# Patient Record
Sex: Female | Born: 1995 | Hispanic: No | Marital: Single | Smoking: Former smoker
Health system: Southern US, Community
[De-identification: ages and names within clinical notes are randomized; demographics above are authoritative.]

## PROBLEM LIST (undated history)

## (undated) ENCOUNTER — Ambulatory Visit (HOSPITAL_COMMUNITY): Admission: EM | Payer: Medicaid Other | Source: Home / Self Care

## (undated) DIAGNOSIS — N9489 Other specified conditions associated with female genital organs and menstrual cycle: Secondary | ICD-10-CM

## (undated) DIAGNOSIS — S060XAA Concussion with loss of consciousness status unknown, initial encounter: Secondary | ICD-10-CM

## (undated) DIAGNOSIS — S060X9A Concussion with loss of consciousness of unspecified duration, initial encounter: Secondary | ICD-10-CM

## (undated) DIAGNOSIS — D649 Anemia, unspecified: Secondary | ICD-10-CM

## (undated) HISTORY — PX: OTHER SURGICAL HISTORY: SHX169

## (undated) HISTORY — PX: WRIST SURGERY: SHX841

---
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
## 2016-08-31 ENCOUNTER — Emergency Department (HOSPITAL_COMMUNITY)
Admission: EM | Admit: 2016-08-31 | Discharge: 2016-08-31 | Disposition: A | Discharge status: Home / Self Care | Payer: Medicaid Other | Attending: Emergency Medicine | Admitting: Emergency Medicine

## 2016-08-31 ENCOUNTER — Emergency Department (HOSPITAL_COMMUNITY): Payer: Medicaid Other

## 2016-08-31 ENCOUNTER — Encounter (HOSPITAL_COMMUNITY): Payer: Self-pay | Admitting: Emergency Medicine

## 2016-08-31 DIAGNOSIS — Y999 Unspecified external cause status: Secondary | ICD-10-CM | POA: Diagnosis not present

## 2016-08-31 DIAGNOSIS — Y929 Unspecified place or not applicable: Secondary | ICD-10-CM | POA: Diagnosis not present

## 2016-08-31 DIAGNOSIS — S0083XA Contusion of other part of head, initial encounter: Secondary | ICD-10-CM | POA: Diagnosis not present

## 2016-08-31 DIAGNOSIS — M545 Low back pain, unspecified: Secondary | ICD-10-CM

## 2016-08-31 DIAGNOSIS — Y939 Activity, unspecified: Secondary | ICD-10-CM | POA: Diagnosis not present

## 2016-08-31 DIAGNOSIS — M542 Cervicalgia: Secondary | ICD-10-CM | POA: Diagnosis not present

## 2016-08-31 DIAGNOSIS — S0011XA Contusion of right eyelid and periocular area, initial encounter: Secondary | ICD-10-CM | POA: Insufficient documentation

## 2016-08-31 DIAGNOSIS — S0081XA Abrasion of other part of head, initial encounter: Secondary | ICD-10-CM | POA: Insufficient documentation

## 2016-08-31 DIAGNOSIS — R0789 Other chest pain: Secondary | ICD-10-CM | POA: Diagnosis not present

## 2016-08-31 DIAGNOSIS — R51 Headache: Secondary | ICD-10-CM

## 2016-08-31 DIAGNOSIS — R519 Headache, unspecified: Secondary | ICD-10-CM

## 2016-08-31 DIAGNOSIS — S0990XA Unspecified injury of head, initial encounter: Secondary | ICD-10-CM | POA: Diagnosis present

## 2016-08-31 MED ORDER — METHOCARBAMOL 500 MG PO TABS
1000.0000 mg | ORAL_TABLET | Freq: Once | ORAL | Status: AC
  Administered 2016-08-31: 1000 mg via ORAL
  Filled 2016-08-31: qty 2

## 2016-08-31 MED ORDER — OXYCODONE-ACETAMINOPHEN 5-325 MG PO TABS
1.0000 | ORAL_TABLET | Freq: Once | ORAL | Status: AC
  Administered 2016-08-31: 1 via ORAL
  Filled 2016-08-31: qty 1

## 2016-08-31 MED ORDER — IBUPROFEN 200 MG PO TABS
600.0000 mg | ORAL_TABLET | Freq: Once | ORAL | Status: AC
  Administered 2016-08-31: 14:00:00 600 mg via ORAL
  Filled 2016-08-31: qty 1

## 2016-08-31 MED ORDER — METHOCARBAMOL 500 MG PO TABS
500.0000 mg | ORAL_TABLET | Freq: Every evening | ORAL | 0 refills | Status: DC | PRN
Start: 2016-08-31 — End: 2018-05-19

## 2016-08-31 MED ORDER — IBUPROFEN 600 MG PO TABS: 600.0000 mg | ORAL_TABLET | Freq: Four times a day (QID) | ORAL | 0 refills | Status: DC | PRN

## 2016-08-31 MED ORDER — LORAZEPAM 1 MG PO TABS
0.5000 mg | ORAL_TABLET | Freq: Once | ORAL | Status: AC
  Administered 2016-08-31: 0.5 mg via ORAL
  Filled 2016-08-31: qty 1

## 2016-08-31 NOTE — Discharge Instructions (Signed)
Take Ibuprofen three times daily for the next week. Take this medicine with food. °Take muscle relaxer at bedtime to help you sleep. This medicine makes you drowsy so do not take before driving or work °Use a heating pad for sore muscles - use for 20 minutes several times a day °Return for worsening symptoms ° °

## 2016-08-31 NOTE — ED Notes (Signed)
Pt voids 250 to bedpan. 
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
## 2016-08-31 NOTE — ED Notes (Signed)
Pt returns from xray. Request further pain meds. Will inform provider.
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
## 2016-08-31 NOTE — ED Notes (Signed)
Pt states she understands instructions. Home stable with steady gait. 

## 2016-09-06 ENCOUNTER — Encounter (HOSPITAL_COMMUNITY): Payer: Self-pay | Admitting: *Deleted

## 2016-09-06 ENCOUNTER — Ambulatory Visit (HOSPITAL_COMMUNITY)
Admission: EM | Admit: 2016-09-06 | Discharge: 2016-09-06 | Disposition: A | Discharge status: Home / Self Care | Payer: Self-pay | Attending: Family Medicine | Admitting: Family Medicine

## 2016-09-06 DIAGNOSIS — K529 Noninfective gastroenteritis and colitis, unspecified: Secondary | ICD-10-CM

## 2016-09-06 DIAGNOSIS — G43A0 Cyclical vomiting, not intractable: Secondary | ICD-10-CM

## 2016-09-06 MED ORDER — ONDANSETRON 4 MG PO TBDP: 4.0000 mg | ORAL_TABLET | Freq: Three times a day (TID) | ORAL | 0 refills | Status: DC | PRN

## 2016-09-06 NOTE — ED Provider Notes (Signed)
CSN: [id number]     Arrival date & time 09/06/16  1647 History   First MD Initiated Contact with Patient 09/06/16 1730     Chief Complaint  Patient presents with  . Emesis   (Consider location/radiation/quality/duration/timing/severity/associated sxs/prior Treatment) Patient c/o nausea and vomiting and diarrhea for 2 days.     The history is provided by the patient.  Emesis  Severity:  Moderate Duration:  2 days Timing:  Constant Progression:  Improving Chronicity:  New Recent urination:  Normal Relieved by:  Nothing Worsened by:  Nothing Associated symptoms: diarrhea     History reviewed. No pertinent past medical history. Past Surgical History:  Procedure Laterality Date  . WRIST SURGERY     History reviewed. No pertinent family history. Social History  Substance Use Topics  . Smoking status: Never Smoker  . Smokeless tobacco: Not on file  . Alcohol use No   OB History    No data available     Review of Systems  Constitutional: Positive for fatigue.  HENT: Negative.   Eyes: Negative.   Respiratory: Negative.   Cardiovascular: Negative.   Gastrointestinal: Positive for diarrhea, nausea and vomiting.  Endocrine: Negative.   Genitourinary: Negative.   Musculoskeletal: Negative.   Skin: Negative.   Hematological: Negative.   Psychiatric/Behavioral: Negative.     Allergies  Lactose intolerance (gi)  Home Medications   Prior to Admission medications   Medication Sig Start Date End Date Taking? Authorizing Provider  Etonogestrel (NEXPLANON [place]) Inject 1 application into the skin once.    Historical Provider, MD  ibuprofen (ADVIL,MOTRIN) 600 MG tablet Take 1 tablet (600 mg total) by mouth every 6 (six) hours as needed. 08/31/16   Bethel BornKelly Marie Gekas, PA-C  MELATONIN ER PO Take 1 tablet by mouth at bedtime as needed.    Historical Provider, MD  methocarbamol (ROBAXIN) 500 MG tablet Take 1 tablet (500 mg total) by mouth at bedtime and may repeat dose one time if  needed. 08/31/16   Bethel BornKelly Marie Gekas, PA-C  ondansetron (ZOFRAN ODT) 4 MG disintegrating tablet Take 1 tablet (4 mg total) by mouth every 8 (eight) hours as needed for nausea or vomiting. 09/06/16   Deatra CanterWilliam J Jory Tanguma, FNP   Meds Ordered and Administered this Visit  Medications - No data to display  BP 130/80 (BP Location: Right Arm)   Pulse 82   Temp 98.6 F (37 C) (Oral)   Resp 18   LMP 08/24/2016   SpO2 100%  No data found.   Physical Exam  Constitutional: She appears well-developed and well-nourished.  HENT:  Head: Normocephalic and atraumatic.  Eyes: Conjunctivae and EOM are normal. Pupils are equal, round, and reactive to light.  Neck: Normal range of motion. Neck supple.  Cardiovascular: Normal rate, regular rhythm and normal heart sounds.   Pulmonary/Chest: Effort normal and breath sounds normal.  Abdominal: Soft. Bowel sounds are normal.  Nursing note and vitals reviewed.   Urgent Care Course     Procedures (including critical care time)  Labs Review Labs Reviewed - No data to display  Imaging Review No results found.   Visual Acuity Review  Right Eye Distance:   Left Eye Distance:   Bilateral Distance:    Right Eye Near:   Left Eye Near:    Bilateral Near:         MDM   1. Acute gastroenteritis   2. Cyclical vomiting with nausea, intractability of vomiting not specified    Zofran ODT 4mg  one  po tid prn #21  Push po fluids, rest, tylenol and motrin otc prn as directed for fever, arthralgias, and myalgias.  Follow up prn if sx's continue or persist.    Deatra Canter, FNP 09/07/16 2012

## 2016-09-06 NOTE — ED Triage Notes (Signed)
Cold   Symptoms  Congested         X   3  Days      Also  Has  Vomiting     And  Diarrhea  X  2  Days

## 2016-12-24 DIAGNOSIS — K5900 Constipation, unspecified: Secondary | ICD-10-CM | POA: Insufficient documentation

## 2016-12-25 ENCOUNTER — Emergency Department (HOSPITAL_COMMUNITY)
Admission: EM | Admit: 2016-12-25 | Discharge: 2016-12-25 | Disposition: A | Discharge status: Home / Self Care | Payer: Self-pay | Attending: Emergency Medicine | Admitting: Emergency Medicine

## 2016-12-25 ENCOUNTER — Emergency Department (HOSPITAL_COMMUNITY): Payer: Self-pay

## 2016-12-25 ENCOUNTER — Encounter (HOSPITAL_COMMUNITY): Payer: Self-pay

## 2016-12-25 DIAGNOSIS — K5900 Constipation, unspecified: Secondary | ICD-10-CM

## 2016-12-25 DIAGNOSIS — R1084 Generalized abdominal pain: Secondary | ICD-10-CM

## 2016-12-25 LAB — CBC
HEMATOCRIT: 31.9 % — AB (ref 36.0–46.0)
HEMOGLOBIN: 9.8 g/dL — AB (ref 12.0–15.0)
MCH: 23.2 pg — ABNORMAL LOW (ref 26.0–34.0)
MCHC: 30.7 g/dL (ref 30.0–36.0)
MCV: 75.4 fL — ABNORMAL LOW (ref 78.0–100.0)
Platelets: 271 10*3/uL (ref 150–400)
RBC: 4.23 MIL/uL (ref 3.87–5.11)
RDW: 16.9 % — AB (ref 11.5–15.5)
WBC: 5.3 10*3/uL (ref 4.0–10.5)

## 2016-12-25 LAB — URINALYSIS, ROUTINE W REFLEX MICROSCOPIC
Bilirubin Urine: NEGATIVE
Glucose, UA: NEGATIVE mg/dL
Hgb urine dipstick: NEGATIVE
KETONES UR: NEGATIVE mg/dL
LEUKOCYTES UA: NEGATIVE
NITRITE: NEGATIVE
PH: 7 (ref 5.0–8.0)
Protein, ur: NEGATIVE mg/dL
Specific Gravity, Urine: >1.046 — ABNORMAL HIGH (ref 1.005–1.030)

## 2016-12-25 LAB — COMPREHENSIVE METABOLIC PANEL
ALBUMIN: 4 g/dL (ref 3.5–5.0)
ALK PHOS: 63 U/L (ref 38–126)
ALT: 12 U/L — ABNORMAL LOW (ref 14–54)
ANION GAP: 7 (ref 5–15)
AST: 26 U/L (ref 15–41)
BUN: <5 mg/dL — ABNORMAL LOW (ref 6–20)
CALCIUM: 9.1 mg/dL (ref 8.9–10.3)
CHLORIDE: 108 mmol/L (ref 101–111)
CO2: 24 mmol/L (ref 22–32)
Creatinine, Ser: 0.82 mg/dL (ref 0.44–1.00)
GFR calc Af Amer: >60 mL/min (ref >60)
GFR calc non Af Amer: >60 mL/min (ref >60)
GLUCOSE: 75 mg/dL (ref 65–99)
POTASSIUM: 4 mmol/L (ref 3.5–5.1)
SODIUM: 139 mmol/L (ref 135–145)
Total Bilirubin: 0.1 mg/dL — ABNORMAL LOW (ref 0.3–1.2)
Total Protein: 6.7 g/dL (ref 6.5–8.1)

## 2016-12-25 LAB — WET PREP, GENITAL
Clue Cells Wet Prep HPF POC: NONE SEEN
Sperm: NONE SEEN
TRICH WET PREP: NONE SEEN
YEAST WET PREP: NONE SEEN

## 2016-12-25 LAB — LIPASE, BLOOD: Lipase: 29 U/L (ref 11–51)

## 2016-12-25 LAB — I-STAT BETA HCG BLOOD, ED (MC, WL, AP ONLY): I-stat hCG, quantitative: <5 m[IU]/mL (ref <5)

## 2016-12-25 LAB — CBG MONITORING, ED: GLUCOSE-CAPILLARY: 75 mg/dL (ref 65–99)

## 2016-12-25 LAB — GC/CHLAMYDIA PROBE AMP (~~LOC~~) NOT AT ARMC
CHLAMYDIA, DNA PROBE: NEGATIVE
NEISSERIA GONORRHEA: NEGATIVE

## 2016-12-25 MED ORDER — ONDANSETRON HCL 4 MG/2ML IJ SOLN
4.0000 mg | Freq: Once | INTRAMUSCULAR | Status: AC
  Administered 2016-12-25: 4 mg via INTRAVENOUS
  Filled 2016-12-25: qty 2

## 2016-12-25 MED ORDER — FENTANYL CITRATE (PF) 100 MCG/2ML IJ SOLN
25.0000 ug | Freq: Once | INTRAMUSCULAR | Status: AC
  Administered 2016-12-25: 25 ug via INTRAVENOUS
  Filled 2016-12-25: qty 2

## 2016-12-25 MED ORDER — SODIUM CHLORIDE 0.9 % IV BOLUS (SEPSIS)
1000.0000 mL | Freq: Once | INTRAVENOUS | Status: AC
  Administered 2016-12-25: 1000 mL via INTRAVENOUS

## 2016-12-25 MED ORDER — GLYCERIN (ADULT) 2 G RE SUPP: 1.0000 | RECTAL | 0 refills | Status: DC | PRN

## 2016-12-25 MED ORDER — IOPAMIDOL (ISOVUE-300) INJECTION 61%
INTRAVENOUS | Status: AC
  Administered 2016-12-25: 100 mL
  Filled 2016-12-25: qty 100

## 2016-12-25 MED ORDER — MAGNESIUM CITRATE PO SOLN
0.5000 | Freq: Once | ORAL | Status: AC
  Administered 2016-12-25: 0.5 via ORAL
  Filled 2016-12-25: qty 296

## 2016-12-25 MED ORDER — POLYETHYLENE GLYCOL 3350 17 GM/SCOOP PO POWD: 17.0000 g | Freq: Two times a day (BID) | ORAL | 0 refills | Status: DC

## 2016-12-25 NOTE — Discharge Instructions (Signed)
Please read and follow all provided instructions.  Your diagnoses today include:  1. Generalized abdominal pain   2. Constipation, unspecified constipation type     Tests performed today include:  Blood counts and electrolytes  Blood tests to check liver and kidney function  Blood tests to check pancreas function  Urine test to look for infection  EKG  CT shows constipation and ruptured right ovarian cyst  Vital signs. See below for your results today.   Medications prescribed:   Miralax - laxative  This medication can be found over-the-counter.   Take any prescribed medications only as directed.  Home care instructions:   Follow any educational materials contained in this packet.  Take the remainder of the bottle of mag citrate when you return home.  Double your fluid intake for the next 3 days.  Take a stool softener such as Colace daily after your stools return to normal  Follow-up instructions: Please follow-up with your primary care provider in the next 7 days for further evaluation of your symptoms.    Return instructions:  SEEK IMMEDIATE MEDICAL ATTENTION IF:  The pain does not go away or becomes severe   A temperature above 101F develops   Repeated vomiting occurs (multiple episodes)   The pain becomes localized to portions of the abdomen. The right side could possibly be appendicitis. In an adult, the left lower portion of the abdomen could be colitis or diverticulitis.   Blood is being passed in stools or vomit (bright red or black tarry stools)   You develop chest pain, difficulty breathing, dizziness or fainting, or become confused, poorly responsive, or inconsolable (young children)  If you have any other emergent concerns regarding your health  Additional Information: Abdominal (belly) pain can be caused by many things. Your caregiver performed an examination and possibly ordered blood/urine tests and imaging (CT scan, x-rays, ultrasound).  Many cases can be observed and treated at home after initial evaluation in the emergency department. Even though you are being discharged home, abdominal pain can be unpredictable. Therefore, you need a repeated exam if your pain does not resolve, returns, or worsens. Most patients with abdominal pain don't have to be admitted to the hospital or have surgery, but serious problems like appendicitis and gallbladder attacks can start out as nonspecific pain. Many abdominal conditions cannot be diagnosed in one visit, so follow-up evaluations are very important.  Your vital signs today were: BP (!) 100/55    Pulse 93    Temp 98.9 F (37.2 C) (Oral)    Resp (!) 23    LMP 12/11/2016 (Approximate)    SpO2 100%  If your blood pressure (bp) was elevated above 135/85 this visit, please have this repeated by your doctor within one month. --------------

## 2016-12-25 NOTE — ED Notes (Addendum)
Still unable to provide urine specimen.  Emily MaduraKelly Humes made aware.  Told not to do in and out cath at this time.

## 2016-12-25 NOTE — ED Provider Notes (Signed)
[facility]-EM[facility]Y DEPT Provider Note   CSN: [id number] Arrival date & time: 12/24/16  2350    History   Chief Complaint Chief Complaint  Patient presents with  . Abdominal Pain  . Loss of Consciousness    HPI DEVEN AUDI is a 21 y.o. female.  21 year old female with no significant past medical history presents to the emergency department for evaluation of abdominal pain. She reports that pain began 3 days ago. She noted pain initially in her epigastrium. It has since become diffuse. Pain is constant, cramping. Patient notices worsening symptoms with movement. She has also noted difficulty urinating, stating that she has not had any void in over 24 hours. Last bowel movement was 2 weeks ago; however, patient notes a history of intermittent constipation. She took a BC powder for symptoms at 1900 tonight without relief of pain. No associated fevers, vomiting, dysuria, hematuria, vaginal bleeding, vaginal discharge. No history of abdominal surgeries.   The history is provided by the patient. No language interpreter was used.  Abdominal Pain    Loss of Consciousness   Associated symptoms include abdominal pain.    History reviewed. No pertinent past medical history.  There are no active problems to display for this patient.   Past Surgical History:  Procedure Laterality Date  . WRIST SURGERY      OB History    No data available       Home Medications    Prior to Admission medications   Medication Sig Start Date End Date Taking? Authorizing Provider  Etonogestrel (NEXPLANON [place]) Inject 1 application into the skin once.    [provider]  ibuprofen (ADVIL,MOTRIN) 600 MG tablet Take 1 tablet (600 mg total) by mouth every 6 (six) hours as needed. 08/31/16   Bethel Born, PA-C  MELATONIN ER PO Take 1 tablet by mouth at bedtime as needed.    [provider]  methocarbamol (ROBAXIN) 500 MG tablet Take 1 tablet (500 mg total) by mouth at bedtime and may  repeat dose one time if needed. 08/31/16   Bethel Born, PA-C  ondansetron (ZOFRAN ODT) 4 MG disintegrating tablet Take 1 tablet (4 mg total) by mouth every 8 (eight) hours as needed for nausea or vomiting. 09/06/16   Deatra Canter, FNP    Family History History reviewed. No pertinent family history.  Social History Social History  Substance Use Topics  . Smoking status: Never Smoker  . Smokeless tobacco: Never Used  . Alcohol use No     Allergies   Lactose intolerance (gi)   Review of Systems Review of Systems  Cardiovascular: Positive for syncope.  Gastrointestinal: Positive for abdominal pain.  Ten systems reviewed and are negative for acute change, except as noted in the HPI.    Physical Exam Updated Vital Signs BP 116/75   Pulse 72   Temp 98.9 F (37.2 C) (Oral)   Resp 15   LMP 12/11/2016 (Approximate)   SpO2 98%   Physical Exam  Constitutional: She is oriented to person, place, and time. She appears well-developed and well-nourished. No distress.  Nontoxic and in NAD. Appears uncomfortable.  HENT:  Head: Normocephalic and atraumatic.  Eyes: Conjunctivae and EOM are normal. No scleral icterus.  Neck: Normal range of motion.  Cardiovascular: Normal rate, regular rhythm and intact distal pulses.   Pulmonary/Chest: Effort normal. No respiratory distress. She has no wheezes. She has no rales.  Lungs CTAB.  Abdominal: Soft. She exhibits no mass. There is  tenderness. There is guarding.  DIffuse TTP with voluntary guarding in the RUQ and RLQ. No peritoneal signs or masses. No rigidity.  Genitourinary: There is no rash, tenderness or lesion on the right labia. There is no rash, tenderness or lesion on the left labia. Uterus is tender (mild). Cervix exhibits no motion tenderness and no friability. Right adnexum displays tenderness (minimal). Right adnexum displays no mass. Left adnexum displays no mass and no tenderness. No bleeding in the vagina. No foreign body in  the vagina. No signs of injury around the vagina. Vaginal discharge (thick, white) found.  Musculoskeletal: Normal range of motion.  Neurological: She is alert and oriented to person, place, and time. She exhibits normal muscle tone. Coordination normal.  Skin: Skin is warm and dry. No rash noted. She is not diaphoretic. No erythema. No pallor.  Psychiatric: She has a normal mood and affect. Her behavior is normal.  Nursing note and vitals reviewed.    ED Treatments / Results  Labs (all labs ordered are listed, but only abnormal results are displayed) Labs Reviewed  CBC - Abnormal; Notable for the following:       Result Value   Hemoglobin 9.8 (*)    HCT 31.9 (*)    MCV 75.4 (*)    MCH 23.2 (*)    RDW 16.9 (*)    All other components within normal limits  COMPREHENSIVE METABOLIC PANEL - Abnormal; Notable for the following:    BUN <5 (*)    ALT 12 (*)    Total Bilirubin 0.1 (*)    All other components within normal limits  WET PREP, GENITAL  LIPASE, BLOOD  URINALYSIS, ROUTINE W REFLEX MICROSCOPIC  I-STAT BETA HCG BLOOD, ED ([facility], [facility], [facility] ONLY)  GC/CHLAMYDIA PROBE AMP ([place]) NOT AT [facility]    EKG  EKG Interpretation None       Radiology No results found.  Procedures Procedures (including critical care time)  Medications Ordered in ED Medications  iopamidol (ISOVUE-300) 61 % injection (not administered)  sodium chloride 0.9 % bolus 1,000 mL (1,000 mLs Intravenous New Bag/Given 12/25/16 0516)  fentaNYL (SUBLIMAZE) injection 25 mcg (25 mcg Intravenous Given 12/25/16 0516)  ondansetron (ZOFRAN) injection 4 mg (4 mg Intravenous Given 12/25/16 0516)     Initial Impression / Assessment and Plan / ED Course  I have reviewed the triage vital signs and the nursing notes.  Pertinent labs & imaging results that were available during my care of the patient were reviewed by me and considered in my medical decision making (see chart for details).     21 year old female  presents to the emergency department for evaluation of abdominal pain. Symptoms began in her epigastrium and has since become diffuse. Patient with generalized tenderness on exam. No peritoneal signs. Patient afebrile with reassuring vital signs. No leukocytosis.  Patient reports inability to void over the past 24 hours. Her bladder scan did show 399cc. Kidney function is preserved. I do not see indication for emergent catheterization.  CT pending to further evaluate cause of patient's symptoms. Patient signed out to Rhea BleacherJosh Geiple, PA-C at shift change who will follow up on imaging and disposition appropriately.    Vitals:   12/25/16 0415 12/25/16 0445 12/25/16 0515 12/25/16 0545  BP: (!) 150/68 107/62 110/62 116/75  Pulse: 74 77 76 72  Resp: 19 16 15    Temp:      TempSrc:      SpO2: 100% 98% 100% 98%    Final Clinical Impressions(s) /  ED Diagnoses   Final diagnoses:  Generalized abdominal pain    New Prescriptions New Prescriptions   No medications on file     Antony Madura, Cordelia Poche 12/25/16 1610    Shon Baton, MD 12/25/16 2302

## 2016-12-25 NOTE — ED Provider Notes (Signed)
Handoff from [facility] at shift change. Patient with abdominal pain and urinary retention. She has noted constipation over the past 2 weeks. Pending CT, UA.  CT demonstrates constipation and right ruptured ovarian cyst. Patient informed. Patient had difficulty with urination, however was able to pass urine in emergency department without catheterization.  Will start on magnesium citrate, MiraLAX, glycerin suppositories. Encourage good hydration and fiber intake. Discussed use of Colace after stools improved.  The patient was urged to return to the Emergency Department immediately with worsening of current symptoms, worsening abdominal pain, persistent vomiting, blood noted in stools, fever, or any other concerns. The patient verbalized understanding.  Discussed that she will need to be reevaluated if abdominal pain and her other symptoms do not improve with treatment of constipation.  Abdomen with minimal generalized tenderness. Patient tolerating ginger ale in the room without vomiting.  Results for orders placed or performed during the hospital encounter of 12/25/16  Wet prep, genital  Result Value Ref Range   Yeast Wet Prep HPF POC NONE SEEN NONE SEEN   Trich, Wet Prep NONE SEEN NONE SEEN   Clue Cells Wet Prep HPF POC NONE SEEN NONE SEEN   WBC, Wet Prep HPF POC MANY (A) NONE SEEN   Sperm NONE SEEN   CBC  Result Value Ref Range   WBC 5.3 4.0 - 10.5 K/uL   RBC 4.23 3.87 - 5.11 MIL/uL   Hemoglobin 9.8 (L) 12.0 - 15.0 g/dL   HCT 16.1 (L) 09.6 - 04.5 %   MCV 75.4 (L) 78.0 - 100.0 fL   MCH 23.2 (L) 26.0 - 34.0 pg   MCHC 30.7 30.0 - 36.0 g/dL   RDW 40.9 (H) 81.1 - 91.4 %   Platelets 271 150 - 400 K/uL  Urinalysis, Routine w reflex microscopic  Result Value Ref Range   Color, Urine YELLOW YELLOW   APPearance CLEAR CLEAR   Specific Gravity, Urine >1.046 (H) 1.005 - 1.030   pH 7.0 5.0 - 8.0   Glucose, UA NEGATIVE NEGATIVE mg/dL   Hgb urine dipstick NEGATIVE NEGATIVE   Bilirubin Urine  NEGATIVE NEGATIVE   Ketones, ur NEGATIVE NEGATIVE mg/dL   Protein, ur NEGATIVE NEGATIVE mg/dL   Nitrite NEGATIVE NEGATIVE   Leukocytes, UA NEGATIVE NEGATIVE  Comprehensive metabolic panel  Result Value Ref Range   Sodium 139 135 - 145 mmol/L   Potassium 4.0 3.5 - 5.1 mmol/L   Chloride 108 101 - 111 mmol/L   CO2 24 22 - 32 mmol/L   Glucose, Bld 75 65 - 99 mg/dL   BUN <5 (L) 6 - 20 mg/dL   Creatinine, Ser 7.82 0.44 - 1.00 mg/dL   Calcium 9.1 8.9 - 95.6 mg/dL   Total Protein 6.7 6.5 - 8.1 g/dL   Albumin 4.0 3.5 - 5.0 g/dL   AST 26 15 - 41 U/L   ALT 12 (L) 14 - 54 U/L   Alkaline Phosphatase 63 38 - 126 U/L   Total Bilirubin 0.1 (L) 0.3 - 1.2 mg/dL   GFR calc non Af Amer >60 >60 mL/min   GFR calc Af Amer >60 >60 mL/min   Anion gap 7 5 - 15  Lipase, blood  Result Value Ref Range   Lipase 29 11 - 51 U/L  CBG monitoring, ED  Result Value Ref Range   Glucose-Capillary 75 65 - 99 mg/dL  I-Stat Beta hCG blood, ED ([facility], [facility], [facility] only)  Result Value Ref Range   I-stat hCG, quantitative <5.0 <5 mIU/mL  Comment 3           Ct Abdomen Pelvis W Contrast  Result Date: 12/25/2016 CLINICAL DATA:  Abdominal pain and diarrhea for 2 days. EXAM: CT ABDOMEN AND PELVIS WITH CONTRAST TECHNIQUE: Multidetector CT imaging of the abdomen and pelvis was performed using the standard protocol following bolus administration of intravenous contrast. CONTRAST:  100mL ISOVUE-300 IOPAMIDOL (ISOVUE-300) INJECTION 61% COMPARISON:  CT abdomen and pelvis April 20, 2013 FINDINGS: LOWER CHEST: Lung bases are clear. Included heart size is normal. No pericardial effusion. HEPATOBILIARY: Contracted gallbladder.  Normal liver. PANCREAS: Normal. SPLEEN: Normal. ADRENALS/URINARY TRACT: Kidneys are orthotopic, demonstrating symmetric enhancement. No nephrolithiasis, hydronephrosis or solid renal masses. The unopacified ureters are normal in course and caliber. Urinary bladder is well distended and unremarkable. Normal adrenal  glands. STOMACH/BOWEL: The stomach, small and large bowel are normal in course and caliber without inflammatory changes, evaluation decreased by lack of oral contrast. Moderate retained large bowel stool. Identifiable portions of the appendix are normal. Small amount of small bowel feces compatible with chronic stasis. VASCULAR/LYMPHATIC: Aortoiliac vessels are normal in course and caliber. No lymphadenopathy by CT size criteria. REPRODUCTIVE: Normal. 3 cm RIGHT adnexal probable corpus luteal cyst. OTHER: Small amount of free fluid in the pelvis predominately around the RIGHT ovary. MUSCULOSKELETAL: Nonacute. IMPRESSION: Moderate retained large bowel stool without bowel obstruction. RIGHT probable ruptured corpus luteal cyst. Electronically Signed   By: Awilda Metroourtnay  Bloomer M.D.   On: 12/25/2016 06:44        Renne CriglerGeiple, Iyanah Demont, PA-C 12/25/16 0901    Shon BatonHorton, Courtney F, MD 12/25/16 [phone]

## 2016-12-25 NOTE — ED Notes (Signed)
Patient discharged. Provided mag citrate to take at home. Advised to take 1/2 bottle when she gets home, and to stay close to the toilet for a bowel movement. Patient verbalized understanding and agreement. Grandmother at bedside - also verbalizes understanding.

## 2016-12-25 NOTE — ED Triage Notes (Signed)
Pt complaining of abdominal pain. Pt also states syncopal episodes. Pt states difficulty urinating. States LBM 2 weeks ago. Pt a/o x 4 at triage.

## 2016-12-25 NOTE — ED Notes (Signed)
Taken to CT at this time. 

## 2017-03-27 ENCOUNTER — Emergency Department (HOSPITAL_COMMUNITY)
Admission: EM | Admit: 2017-03-27 | Discharge: 2017-03-27 | Disposition: A | Discharge status: Home / Self Care | Payer: Self-pay | Attending: Emergency Medicine | Admitting: Emergency Medicine

## 2017-03-27 ENCOUNTER — Encounter (HOSPITAL_COMMUNITY): Payer: Self-pay | Admitting: Emergency Medicine

## 2017-03-27 DIAGNOSIS — R51 Headache: Secondary | ICD-10-CM | POA: Insufficient documentation

## 2017-03-27 DIAGNOSIS — Z5321 Procedure and treatment not carried out due to patient leaving prior to being seen by health care provider: Secondary | ICD-10-CM | POA: Insufficient documentation

## 2017-03-27 LAB — CBC WITH DIFFERENTIAL/PLATELET
BASOS ABS: 0 10*3/uL (ref 0.0–0.1)
Basophils Relative: 0 %
Eosinophils Absolute: 0.3 10*3/uL (ref 0.0–0.7)
Eosinophils Relative: 4 %
HEMATOCRIT: 31.8 % — AB (ref 36.0–46.0)
Hemoglobin: 9.9 g/dL — ABNORMAL LOW (ref 12.0–15.0)
LYMPHS PCT: 46 %
Lymphs Abs: 3.3 10*3/uL (ref 0.7–4.0)
MCH: 22.2 pg — ABNORMAL LOW (ref 26.0–34.0)
MCHC: 31.1 g/dL (ref 30.0–36.0)
MCV: 71.5 fL — AB (ref 78.0–100.0)
MONO ABS: 0.5 10*3/uL (ref 0.1–1.0)
MONOS PCT: 7 %
NEUTROS ABS: 3.1 10*3/uL (ref 1.7–7.7)
Neutrophils Relative %: 43 %
PLATELETS: 253 10*3/uL (ref 150–400)
RBC: 4.45 MIL/uL (ref 3.87–5.11)
RDW: 17.9 % — ABNORMAL HIGH (ref 11.5–15.5)
WBC: 7.3 10*3/uL (ref 4.0–10.5)

## 2017-03-27 LAB — I-STAT CHEM 8, ED
BUN: 11 mg/dL (ref 6–20)
CHLORIDE: 106 mmol/L (ref 101–111)
Calcium, Ion: 1.19 mmol/L (ref 1.15–1.40)
Creatinine, Ser: 0.9 mg/dL (ref 0.44–1.00)
GLUCOSE: 105 mg/dL — AB (ref 65–99)
HCT: 34 % — ABNORMAL LOW (ref 36.0–46.0)
Hemoglobin: 11.6 g/dL — ABNORMAL LOW (ref 12.0–15.0)
POTASSIUM: 3.6 mmol/L (ref 3.5–5.1)
Sodium: 139 mmol/L (ref 135–145)
TCO2: 22 mmol/L (ref 22–32)

## 2017-03-27 NOTE — ED Triage Notes (Signed)
Patient with migraine headache for the last four days, sensitive to light and noise.  States that she is not nauseated at this time due to not eating for the last few days.

## 2017-03-27 NOTE — ED Notes (Signed)
This NS called pt to go back to the hallway bed x4, and no answer. [facility] [employer] notified.

## 2017-05-07 ENCOUNTER — Emergency Department (HOSPITAL_COMMUNITY)
Admission: EM | Admit: 2017-05-07 | Discharge: 2017-05-08 | Disposition: A | Discharge status: Home / Self Care | Payer: Self-pay | Attending: Emergency Medicine | Admitting: Emergency Medicine

## 2017-05-07 ENCOUNTER — Encounter (HOSPITAL_COMMUNITY): Payer: Self-pay

## 2017-05-07 DIAGNOSIS — E041 Nontoxic single thyroid nodule: Secondary | ICD-10-CM | POA: Insufficient documentation

## 2017-05-07 DIAGNOSIS — M542 Cervicalgia: Secondary | ICD-10-CM | POA: Insufficient documentation

## 2017-05-07 DIAGNOSIS — R110 Nausea: Secondary | ICD-10-CM | POA: Insufficient documentation

## 2017-05-07 DIAGNOSIS — R51 Headache: Secondary | ICD-10-CM | POA: Insufficient documentation

## 2017-05-07 DIAGNOSIS — S0083XA Contusion of other part of head, initial encounter: Secondary | ICD-10-CM | POA: Insufficient documentation

## 2017-05-07 DIAGNOSIS — Y929 Unspecified place or not applicable: Secondary | ICD-10-CM | POA: Insufficient documentation

## 2017-05-07 DIAGNOSIS — S0990XA Unspecified injury of head, initial encounter: Secondary | ICD-10-CM

## 2017-05-07 DIAGNOSIS — Y9301 Activity, walking, marching and hiking: Secondary | ICD-10-CM | POA: Insufficient documentation

## 2017-05-07 DIAGNOSIS — W108XXA Fall (on) (from) other stairs and steps, initial encounter: Secondary | ICD-10-CM | POA: Insufficient documentation

## 2017-05-07 DIAGNOSIS — Y998 Other external cause status: Secondary | ICD-10-CM | POA: Insufficient documentation

## 2017-05-07 LAB — POC URINE PREG, ED: Preg Test, Ur: NEGATIVE

## 2017-05-07 NOTE — ED Triage Notes (Signed)
Pt states that she slipped and fell down about 8 stairs, hit her head and R side of her face, no LOC, no complaints of pain except for facial.

## 2017-05-07 NOTE — ED Notes (Signed)
Called for with no response

## 2017-05-07 NOTE — ED Notes (Signed)
Spoke with MD regarding CT order, advised to wait until physician sees pt

## 2017-05-08 ENCOUNTER — Emergency Department (HOSPITAL_COMMUNITY): Payer: Self-pay

## 2017-05-08 MED ORDER — ONDANSETRON 4 MG PO TBDP: 4.0000 mg | ORAL_TABLET | Freq: Three times a day (TID) | ORAL | 0 refills | Status: DC | PRN

## 2017-05-08 MED ORDER — HYDROCODONE-ACETAMINOPHEN 5-325 MG PO TABS
2.0000 | ORAL_TABLET | Freq: Once | ORAL | Status: AC
  Administered 2017-05-08: 2 via ORAL
  Filled 2017-05-08: qty 2

## 2017-05-08 MED ORDER — ONDANSETRON 4 MG PO TBDP
4.0000 mg | ORAL_TABLET | Freq: Once | ORAL | Status: AC
  Administered 2017-05-08: 4 mg via ORAL
  Filled 2017-05-08: qty 1

## 2017-05-08 NOTE — ED Notes (Signed)
Unable to obtain discharge signature due to wow having no signature pad, pt verbalized understanding of dc papers, follow up instructions and rx. Nad.

## 2017-05-08 NOTE — ED Notes (Signed)
Patient transported to CT 

## 2017-05-08 NOTE — ED Provider Notes (Signed)
TIME SEEN: 2:24 AM  CHIEF COMPLAINT: Facial pain  HPI: Patient is a 21 year old female who presents to the emergency department after she slipped and fell on wet stairs at her house around 6 PM last night.  Hit the right side of her face.  Complaining of headache and had brief episode of double vision and numbness in her left arm that have resolved.  Also complaining of neck pain and right facial pain with associated swelling.  No loss of consciousness.  Not on blood thinners.  No current numbness, tingling or focal weakness.  No thoracic or lumbar back pain.  No chest or abdominal pain.  No extremity pain.  She reports feeling nauseated.  No vomiting.  ROS: See HPI Constitutional: no fever  Eyes: no drainage  ENT: no runny nose   Cardiovascular:  no chest pain  Resp: no SOB  GI: no vomiting GU: no dysuria Integumentary: no rash  Allergy: no hives  Musculoskeletal: no leg swelling  Neurological: no slurred speech ROS otherwise negative  PAST MEDICAL HISTORY/PAST SURGICAL HISTORY:  History reviewed. No pertinent past medical history.  MEDICATIONS:  Prior to Admission medications   Medication Sig Start Date End Date Taking? Authorizing Provider  glycerin adult 2 g suppository Place 1 suppository rectally as needed for constipation. Patient not taking: Reported on 05/08/2017 12/25/16   Renne CriglerGeiple, Joshua, PA-C  ibuprofen (ADVIL,MOTRIN) 600 MG tablet Take 1 tablet (600 mg total) by mouth every 6 (six) hours as needed. Patient not taking: Reported on 05/08/2017 08/31/16   Bethel BornGekas, Kelly Marie, PA-C  methocarbamol (ROBAXIN) 500 MG tablet Take 1 tablet (500 mg total) by mouth at bedtime and may repeat dose one time if needed. Patient not taking: Reported on 05/08/2017 08/31/16   Bethel BornGekas, Kelly Marie, PA-C  ondansetron (ZOFRAN ODT) 4 MG disintegrating tablet Take 1 tablet (4 mg total) by mouth every 8 (eight) hours as needed for nausea or vomiting. Patient not taking: Reported on 05/08/2017 09/06/16   Deatra Canterxford,  William J, FNP  polyethylene glycol powder (GLYCOLAX/MIRALAX) powder Take 17 g by mouth 2 (two) times daily. Patient not taking: Reported on 05/08/2017 12/25/16   Renne CriglerGeiple, Joshua, PA-C    ALLERGIES:  Allergies  Allergen Reactions  . Lactose Intolerance (Gi) Other (See Comments)    Stomach pain, cramping, nausea    SOCIAL HISTORY:  Social History   Tobacco Use  . Smoking status: Never Smoker  . Smokeless tobacco: Never Used  Substance Use Topics  . Alcohol use: No    FAMILY HISTORY: No family history on file.  EXAM: BP (!) 124/91 (BP Location: Left Arm)   Pulse 78   Temp 99.4 F (37.4 C) (Oral)   Resp 16   LMP 05/03/2017   SpO2 100%  CONSTITUTIONAL: Alert and oriented and responds appropriately to questions. Well-appearing; well-nourished; GCS 15 HEAD: Normocephalic; right-sided facial swelling over the chin and mandible EYES: Conjunctivae clear, PERRL, EOMI ENT: normal nose; no rhinorrhea; moist mucous membranes; pharynx without lesions noted; no dental injury; no septal hematoma NECK: Supple, no meningismus, no LAD; no midline step-off or deformity; trachea midline, patient does have some very minimal midline spinal tenderness on exam CARD: RRR; S1 and S2 appreciated; no murmurs, no clicks, no rubs, no gallops RESP: Normal chest excursion without splinting or tachypnea; breath sounds clear and equal bilaterally; no wheezes, no rhonchi, no rales; no hypoxia or respiratory distress CHEST:  chest wall stable, no crepitus or ecchymosis or deformity, nontender to palpation; no flail chest ABD/GI: Normal  bowel sounds; non-distended; soft, non-tender, no rebound, no guarding; no ecchymosis or other lesions noted PELVIS:  stable, nontender to palpation BACK:  The back appears normal and is non-tender to palpation, there is no CVA tenderness; no midline spinal tenderness, step-off or deformity EXT: Normal ROM in all joints; non-tender to palpation; no edema; normal capillary refill;  no cyanosis, no bony tenderness or bony deformity of patient's extremities, no joint effusion, compartments are soft, extremities are warm and well-perfused, no ecchymosis SKIN: Normal color for age and race; warm NEURO: Moves all extremities equally, sensation to light touch intact diffusely, cranial nerves II through XII intact, normal speech, normal gait PSYCH: The patient's mood and manner are appropriate. Grooming and personal hygiene are appropriate.  MEDICAL DECISION MAKING: Patient here after mechanical fall.  Will obtain CT of the head, cervical spine and face.  Will give pain and nausea medication.  Pregnancy test negative.  ED PROGRESS: CT scans unremarkable other than right facial soft tissue swelling.  She has an incidental 2.1 cm left thyroid nodule.  We have recommended PCP follow-up for nonemergent ultrasound.  I feel she is safe for discharge home.  Recommended alternating Tylenol and Motrin.  Will discharge with prescription of Zofran.  Suspect concussion.  Discussed head injury return precautions and supportive care instructions.   At this time, I do not feel there is any life-threatening condition present. I have reviewed and discussed all results (EKG, imaging, lab, urine as appropriate) and exam findings with patient/family. I have reviewed nursing notes and appropriate previous records.  I feel the patient is safe to be discharged home without further emergent workup and can continue workup as an outpatient as needed. Discussed usual and customary return precautions. Patient/family verbalize understanding and are comfortable with this plan.  Outpatient follow-up has been provided if needed. All questions have been answered.      Konor Noren, Layla Maw, DO 05/08/17 [phone]

## 2017-05-08 NOTE — ED Notes (Signed)
Pt has been outside when being called for a room

## 2017-05-08 NOTE — Discharge Instructions (Signed)
You may alternate Tylenol 1000 mg every 6 hours as needed for pain and Ibuprofen 800 mg every 8 hours as needed for pain.  Please take Ibuprofen with food.   Please avoid alcohol for the next week.  Please rest and drink plenty of water.  We recommend that you avoid any activity that may lead to another head injury for at least 1 week or until your symptoms have completely resolved.  We also recommend "brain rest" - please avoid TV, cell phones, tablets, computers as much as possible for the next 48 hours.    You were found to have a 2.1 cm left thyroid nodule.  You should have an ultrasound of this as an outpatient.  This can be done by primary care doctor.    To find a primary care or specialty doctor please call [phone] or [phone] to access "[facility] Find a Doctor Service."  You may also go on the [facility] website at [email or web address]  There are also multiple [facility], [facility], [facility] and [facility] practices throughout the [place] that are frequently accepting new patients. You may find a clinic that is close to your home and contact them.  [facility] -  [address] [place] [id number] [phone]   [facility] -  [address] [place] [address] [phone]   [facility] [phone]  [place] [phone]

## 2018-05-19 ENCOUNTER — Encounter (HOSPITAL_COMMUNITY): Payer: Self-pay | Admitting: Oncology

## 2018-05-19 ENCOUNTER — Emergency Department (HOSPITAL_COMMUNITY)
Admission: EM | Admit: 2018-05-19 | Discharge: 2018-05-19 | Disposition: A | Discharge status: Home / Self Care | Payer: Self-pay | Attending: Emergency Medicine | Admitting: Emergency Medicine

## 2018-05-19 ENCOUNTER — Emergency Department (HOSPITAL_COMMUNITY): Payer: Self-pay

## 2018-05-19 ENCOUNTER — Other Ambulatory Visit: Payer: Self-pay

## 2018-05-19 DIAGNOSIS — N946 Dysmenorrhea, unspecified: Secondary | ICD-10-CM

## 2018-05-19 DIAGNOSIS — N760 Acute vaginitis: Secondary | ICD-10-CM | POA: Insufficient documentation

## 2018-05-19 DIAGNOSIS — B9689 Other specified bacterial agents as the cause of diseases classified elsewhere: Secondary | ICD-10-CM

## 2018-05-19 DIAGNOSIS — R52 Pain, unspecified: Secondary | ICD-10-CM

## 2018-05-19 LAB — BASIC METABOLIC PANEL
Anion gap: 11 (ref 5–15)
BUN: 6 mg/dL (ref 6–20)
CHLORIDE: 101 mmol/L (ref 98–111)
CO2: 25 mmol/L (ref 22–32)
Calcium: 9.8 mg/dL (ref 8.9–10.3)
Creatinine, Ser: 0.9 mg/dL (ref 0.44–1.00)
GFR calc non Af Amer: >60 mL/min (ref >60)
Glucose, Bld: 102 mg/dL — ABNORMAL HIGH (ref 70–99)
POTASSIUM: 3.6 mmol/L (ref 3.5–5.1)
Sodium: 137 mmol/L (ref 135–145)

## 2018-05-19 LAB — I-STAT BETA HCG BLOOD, ED (MC, WL, AP ONLY): I-stat hCG, quantitative: <5 m[IU]/mL (ref <5)

## 2018-05-19 LAB — CBC WITH DIFFERENTIAL/PLATELET
Abs Immature Granulocytes: 0.03 10*3/uL (ref 0.00–0.07)
BASOS ABS: 0 10*3/uL (ref 0.0–0.1)
Basophils Relative: 0 %
EOS ABS: 0.2 10*3/uL (ref 0.0–0.5)
Eosinophils Relative: 3 %
HEMATOCRIT: 36.8 % (ref 36.0–46.0)
HEMOGLOBIN: 10.8 g/dL — AB (ref 12.0–15.0)
IMMATURE GRANULOCYTES: 0 %
LYMPHS ABS: 2 10*3/uL (ref 0.7–4.0)
LYMPHS PCT: 28 %
MCH: 22.6 pg — ABNORMAL LOW (ref 26.0–34.0)
MCHC: 29.3 g/dL — ABNORMAL LOW (ref 30.0–36.0)
MCV: 77.1 fL — ABNORMAL LOW (ref 80.0–100.0)
Monocytes Absolute: 0.6 10*3/uL (ref 0.1–1.0)
Monocytes Relative: 9 %
NEUTROS PCT: 60 %
NRBC: 0 % (ref 0.0–0.2)
Neutro Abs: 4.3 10*3/uL (ref 1.7–7.7)
Platelets: 305 10*3/uL (ref 150–400)
RBC: 4.77 MIL/uL (ref 3.87–5.11)
RDW: 16.1 % — AB (ref 11.5–15.5)
WBC: 7.1 10*3/uL (ref 4.0–10.5)

## 2018-05-19 LAB — WET PREP, GENITAL
Sperm: NONE SEEN
TRICH WET PREP: NONE SEEN
WBC, Wet Prep HPF POC: NONE SEEN
YEAST WET PREP: NONE SEEN

## 2018-05-19 MED ORDER — ONDANSETRON HCL 4 MG/2ML IJ SOLN
4.0000 mg | Freq: Once | INTRAMUSCULAR | Status: AC
  Administered 2018-05-19: 4 mg via INTRAVENOUS
  Filled 2018-05-19: qty 2

## 2018-05-19 MED ORDER — MORPHINE SULFATE (PF) 4 MG/ML IV SOLN
4.0000 mg | Freq: Once | INTRAVENOUS | Status: AC
  Administered 2018-05-19: 4 mg via INTRAVENOUS
  Filled 2018-05-19: qty 1

## 2018-05-19 MED ORDER — METRONIDAZOLE 500 MG PO TABS: 500.0000 mg | ORAL_TABLET | Freq: Two times a day (BID) | ORAL | 0 refills | Status: DC

## 2018-05-19 MED ORDER — IBUPROFEN 600 MG PO TABS: 600.0000 mg | ORAL_TABLET | Freq: Four times a day (QID) | ORAL | 0 refills | Status: DC | PRN

## 2018-05-19 MED ORDER — IBUPROFEN 400 MG PO TABS
600.0000 mg | ORAL_TABLET | Freq: Once | ORAL | Status: AC
  Administered 2018-05-19: 09:00:00 600 mg via ORAL
  Filled 2018-05-19: qty 1

## 2018-05-19 NOTE — ED Notes (Signed)
Patient transported to Ultrasound 

## 2018-05-19 NOTE — ED Provider Notes (Signed)
[facility] EMERGENCY DEPARTMENT Provider Note   CSN: [id number] Arrival date & time: 05/19/18  0414     History   Chief Complaint Chief Complaint  Patient presents with  . Vaginal Bleeding    HPI Emily Yu is a 22 y.o. female.  Patient presents to the emergency department with a chief complaint of vaginal bleeding x4 days.  She reports associated lower abdominal cramping.  States that she finished her last menstrual period on 11/7.  He has not taken anything for pain.  She reports feeling lightheaded and dizzy.  Reports using 1 tampon per hour.  The history is provided by the patient. No language interpreter was used.    History reviewed. No pertinent past medical history.  There are no active problems to display for this patient.   Past Surgical History:  Procedure Laterality Date  . WRIST SURGERY       OB History   None      Home Medications    Prior to Admission medications   Medication Sig Start Date End Date Taking? Authorizing Provider  glycerin adult 2 g suppository Place 1 suppository rectally as needed for constipation. Patient not taking: Reported on 05/08/2017 12/25/16   Renne Crigler, PA-C  ibuprofen (ADVIL,MOTRIN) 600 MG tablet Take 1 tablet (600 mg total) by mouth every 6 (six) hours as needed. Patient not taking: Reported on 05/08/2017 08/31/16   Bethel Born, PA-C  methocarbamol (ROBAXIN) 500 MG tablet Take 1 tablet (500 mg total) by mouth at bedtime and may repeat dose one time if needed. Patient not taking: Reported on 05/08/2017 08/31/16   Bethel Born, PA-C  ondansetron (ZOFRAN ODT) 4 MG disintegrating tablet Take 1 tablet (4 mg total) every 8 (eight) hours as needed by mouth for nausea or vomiting. 05/08/17   Ward, Layla Maw, DO  polyethylene glycol powder (GLYCOLAX/MIRALAX) powder Take 17 g by mouth 2 (two) times daily. Patient not taking: Reported on 05/08/2017 12/25/16   Renne Crigler, PA-C    Family History No  family history on file.  Social History Social History   Tobacco Use  . Smoking status: Never Smoker  . Smokeless tobacco: Never Used  Substance Use Topics  . Alcohol use: No  . Drug use: No     Allergies   Lactose intolerance (gi)   Review of Systems Review of Systems  All other systems reviewed and are negative.    Physical Exam Updated Vital Signs BP (!) 146/100 (BP Location: Left Arm)   Pulse (!) 106   Temp 98.7 F (37.1 C) (Oral)   Resp 16   Ht 5\' 4"  (1.626 m)   Wt 60 kg   LMP 05/04/2018 (Exact Date)   SpO2 100%   BMI 22.71 kg/m   Physical Exam  Constitutional: She is oriented to person, place, and time. She appears well-developed and well-nourished.  HENT:  Head: Normocephalic and atraumatic.  Eyes: Pupils are equal, round, and reactive to light. Conjunctivae and EOM are normal.  Neck: Normal range of motion. Neck supple.  Cardiovascular: Normal rate and regular rhythm. Exam reveals no gallop and no friction rub.  No murmur heard. Pulmonary/Chest: Effort normal and breath sounds normal. No respiratory distress. She has no wheezes. She has no rales. She exhibits no tenderness.  Abdominal: Soft. Bowel sounds are normal. She exhibits no distension and no mass. There is no tenderness. There is no rebound and no guarding.  Genitourinary:  Genitourinary Comments: Cervical loss is closed,  tenderness to palpation of uterus and right adnexa, moderate amount of blood in vaginal vault  Chaperone present for exam  Musculoskeletal: Normal range of motion. She exhibits no edema or tenderness.  Neurological: She is alert and oriented to person, place, and time.  Skin: Skin is warm and dry.  Psychiatric: She has a normal mood and affect. Her behavior is normal. Judgment and thought content normal.  Nursing note and vitals reviewed.    ED Treatments / Results  Labs (all labs ordered are listed, but only abnormal results are displayed) Labs Reviewed  WET PREP,  GENITAL  CBC WITH DIFFERENTIAL/PLATELET  BASIC METABOLIC PANEL  I-STAT BETA HCG BLOOD, ED ([facility], [facility], [facility] ONLY)  GC/CHLAMYDIA PROBE AMP ([place]) NOT AT [facility]    EKG None  Radiology No results found.  Procedures Procedures (including critical care time)  Medications Ordered in ED Medications  morphine 4 MG/ML injection 4 mg (has no administration in time range)  ondansetron (ZOFRAN) injection 4 mg (has no administration in time range)     Initial Impression / Assessment and Plan / ED Course  I have reviewed the triage vital signs and the nursing notes.  Pertinent labs & imaging results that were available during my care of the patient were reviewed by me and considered in my medical decision making (see chart for details).     With menstrual cramps and vaginal bleeding x4 days.  Last menstrual concluded on 11/7.    Tender to palpation on exam, right adnexal and uterine tenderness on pelvic exam.  She does have some blood in the vaginal vault.  Will check labs.  Will check ultrasound.  Patient signed out to oncoming team.  Final Clinical Impressions(s) / ED Diagnoses   Final diagnoses:  Pain    ED Discharge Orders    None       Roxy HorsemanBrowning, Rayne Loiseau, PA-C 05/19/18 [id number]    Glynn Octaveancour, Stephen, MD 05/19/18 [phone]

## 2018-05-19 NOTE — ED Provider Notes (Signed)
22 year old female [facility].  Per his HPI:  "Patient presents to the emergency department with a chief complaint of vaginal bleeding x4 days.  She reports associated lower abdominal cramping.  States that she finished her last menstrual period on 11/7.  He has not taken anything for pain.  She reports feeling lightheaded and dizzy.  Reports using 1 tampon per hour."  Physical Exam  BP 137/90   Pulse (!) 111   Temp 98.7 F (37.1 C) (Oral)   Resp 16   Ht 5\' 4"  (1.626 m)   Wt 60 kg   LMP 05/04/2018 (Exact Date)   SpO2 100%   BMI 22.71 kg/m   Physical Exam  Appears anxious. NAD.   ED Course/Procedures     Procedures  MDM   22 year old female [facility].  Patient's hemoglobin has been reviewed at 10.8 and is close to her baseline.  Recommended she continue her home iron supplementation.  Pelvic ultrasound with 6 mm endometrium and questionable endometrial polyp.  Repeat pelvic ultrasound was recommended in 6 to 10 weeks.  The patient has not established with OB/GYN, and I have given her a referral for follow-up.  Ibuprofen given for pain control in the ED.  Wet prep showing bacterial vaginosis.  Will discharge with Flagyl and the patient was counseled on avoiding alcohol.  Patient is aware gonorrhea and chlamydia labs are pending.  Patient was counseled on safe sex practice.  Strict return precautions given.  She is hemodynamically stable and in no acute distress.  She is safe for discharge home with outpatient follow-up at this time.       Barkley BoardsMcDonald, Deundra Furber A, PA-C 05/19/18 [id number]    Glynn Octaveancour, Stephen, MD 05/19/18 [phone]

## 2018-05-19 NOTE — ED Triage Notes (Signed)
Vaginal bleeding x four days.  Pt's cycle ended 11/7.  Pt reports clot that are, "stingy."  Pt states this isn't her period.  Reports using one super plus tampon per hour.  +abdominal cramping, lightheaded and dizzy.

## 2018-05-19 NOTE — Discharge Instructions (Addendum)
Thank you for allowing me to care for you today in the Emergency Department.   Please call the number above to schedule a follow-up appointment with an OB/GYN.  I would recommend calling when their office reopens tomorrow because sometimes it can take several days to schedule an appointment.  A repeat pelvic ultrasound was recommended in 6 to 10 weeks.  Your gonorrhea and Chlamydia test are pending.  If positive, someone from the hospital will call you.  Use a condom every time you have sex to prevent sexually transmitted infections.   Your pelvic exam did show bacterial vaginosis.  To treat this, take 1 tablet of Flagyl 2 times daily for the next week.  Do not drink alcohol while taking this medication because it can cause severe vomiting.  Take 600 mg of ibuprofen with food every 6 hours or 650 mg of Tylenol.  You can also alternate between these 2 medications.  Heating pads can also help with pain.   Return to the emergency department if you become extremely short of breath walking from room to room, if your skin becomes pale, if your vaginal bleeding worsens in the next 3 to 4 days, if you pass out, or if you develop other new, concerning symptoms.

## 2018-05-20 LAB — GC/CHLAMYDIA PROBE AMP (~~LOC~~) NOT AT ARMC
CHLAMYDIA, DNA PROBE: NEGATIVE
Neisseria Gonorrhea: NEGATIVE

## 2018-07-25 ENCOUNTER — Emergency Department (HOSPITAL_COMMUNITY): Payer: Self-pay

## 2018-07-25 ENCOUNTER — Encounter (HOSPITAL_COMMUNITY): Payer: Self-pay | Admitting: Emergency Medicine

## 2018-07-25 ENCOUNTER — Emergency Department (HOSPITAL_COMMUNITY)
Admission: EM | Admit: 2018-07-25 | Discharge: 2018-07-25 | Disposition: A | Discharge status: Home / Self Care | Payer: Self-pay | Attending: Emergency Medicine | Admitting: Emergency Medicine

## 2018-07-25 DIAGNOSIS — B9689 Other specified bacterial agents as the cause of diseases classified elsewhere: Secondary | ICD-10-CM

## 2018-07-25 DIAGNOSIS — N760 Acute vaginitis: Secondary | ICD-10-CM | POA: Insufficient documentation

## 2018-07-25 DIAGNOSIS — R52 Pain, unspecified: Secondary | ICD-10-CM

## 2018-07-25 DIAGNOSIS — R102 Pelvic and perineal pain: Secondary | ICD-10-CM

## 2018-07-25 LAB — WET PREP, GENITAL
SPERM: NONE SEEN
TRICH WET PREP: NONE SEEN
Yeast Wet Prep HPF POC: NONE SEEN

## 2018-07-25 LAB — URINALYSIS, ROUTINE W REFLEX MICROSCOPIC
Bilirubin Urine: NEGATIVE
GLUCOSE, UA: NEGATIVE mg/dL
Hgb urine dipstick: NEGATIVE
KETONES UR: NEGATIVE mg/dL
LEUKOCYTES UA: NEGATIVE
Nitrite: NEGATIVE
PROTEIN: NEGATIVE mg/dL
Specific Gravity, Urine: 1.006 (ref 1.005–1.030)
pH: 8 (ref 5.0–8.0)

## 2018-07-25 LAB — PREGNANCY, URINE: PREG TEST UR: NEGATIVE

## 2018-07-25 MED ORDER — ONDANSETRON 4 MG PO TBDP
4.0000 mg | ORAL_TABLET | Freq: Once | ORAL | Status: AC
  Administered 2018-07-25: 4 mg via ORAL
  Filled 2018-07-25: qty 1

## 2018-07-25 MED ORDER — AZITHROMYCIN 250 MG PO TABS
1000.0000 mg | ORAL_TABLET | Freq: Once | ORAL | Status: AC
  Administered 2018-07-25: 1000 mg via ORAL
  Filled 2018-07-25: qty 4

## 2018-07-25 MED ORDER — IBUPROFEN 400 MG PO TABS
600.0000 mg | ORAL_TABLET | Freq: Once | ORAL | Status: AC
  Administered 2018-07-25: 09:00:00 600 mg via ORAL
  Filled 2018-07-25: qty 1

## 2018-07-25 MED ORDER — CEFTRIAXONE SODIUM 250 MG IJ SOLR
250.0000 mg | Freq: Once | INTRAMUSCULAR | Status: AC
  Administered 2018-07-25: 250 mg via INTRAMUSCULAR
  Filled 2018-07-25: qty 250

## 2018-07-25 MED ORDER — METRONIDAZOLE 500 MG PO TABS: 500.0000 mg | ORAL_TABLET | Freq: Two times a day (BID) | ORAL | 0 refills | Status: DC

## 2018-07-25 MED ORDER — STERILE WATER FOR INJECTION IJ SOLN
INTRAMUSCULAR | Status: AC
  Administered 2018-07-25: 10 mL
  Filled 2018-07-25: qty 10

## 2018-07-25 NOTE — ED Notes (Signed)
Patient transported to Ultrasound 

## 2018-07-25 NOTE — ED Notes (Signed)
Patient verbalizes understanding of discharge instructions. Opportunity for questioning and answers were provided. Armband removed by staff, pt discharged from ED.  

## 2018-07-25 NOTE — Discharge Instructions (Signed)
Return if any problems.   Your Ultrasound shows thickening to the lining of your uterus.   Schedule follow up with Gynecology for evaluation

## 2018-07-25 NOTE — ED Triage Notes (Addendum)
  Patient comes in with pelvic pain that has been going on for about a month.  Patient was seen a month ago and had some vaginal bleeding.  Patient was told to track her menses.  Pain 8/10.  Patient A&O x4.  Patient states she took a Plan B pill two weeks ago.

## 2018-07-25 NOTE — ED Provider Notes (Signed)
[facility] EMERGENCY DEPARTMENT Provider Note   CSN: [id number] Arrival date & time: 07/25/18  0608     History   Chief Complaint Chief Complaint  Patient presents with  . Pelvic Pain    HPI Emily Yu is a 23 y.o. female.  The history is provided by the patient. No language interpreter was used.  Pelvic Pain  This is a new problem. The current episode started 6 to 12 hours ago. The problem occurs constantly. The problem has not changed since onset.Associated symptoms include abdominal pain. Nothing aggravates the symptoms. Nothing relieves the symptoms. She has tried nothing for the symptoms. The treatment provided no relief.  Pt reports she was told that she has been exposed to chlamydia.  Pt reports she was here a month ago and given rx for flagyl that she did not take.   History reviewed. No pertinent past medical history.  There are no active problems to display for this patient.   Past Surgical History:  Procedure Laterality Date  . WRIST SURGERY       OB History   No obstetric history on file.      Home Medications    Prior to Admission medications   Medication Sig Start Date End Date Taking? Authorizing Provider  ferrous sulfate 325 (65 FE) MG tablet Take 325 mg by mouth as needed (menstural).   Yes [provider]  ibuprofen (ADVIL,MOTRIN) 600 MG tablet Take 1 tablet (600 mg total) by mouth every 6 (six) hours as needed. Patient not taking: Reported on 07/25/2018 05/19/18   McDonald, Pedro Earls A, PA-C  metroNIDAZOLE (FLAGYL) 500 MG tablet Take 1 tablet (500 mg total) by mouth 2 (two) times daily. 07/25/18   Elson Areas, PA-C    Family History History reviewed. No pertinent family history.  Social History Social History   Tobacco Use  . Smoking status: Never Smoker  . Smokeless tobacco: Never Used  Substance Use Topics  . Alcohol use: No  . Drug use: No     Allergies   Lactose intolerance (gi)   Review of  Systems Review of Systems  Gastrointestinal: Positive for abdominal pain.  Genitourinary: Positive for pelvic pain and vaginal discharge.  All other systems reviewed and are negative.    Physical Exam Updated Vital Signs BP 133/73 (BP Location: Right Arm)   Pulse 70   Temp 98.3 F (36.8 C) (Oral)   Resp 18   Ht 5\' 4"  (1.626 m)   Wt 61.2 kg   SpO2 100%   BMI 23.17 kg/m   Physical Exam Vitals signs and nursing note reviewed.  HENT:     Head: Normocephalic.     Mouth/Throat:     Mouth: Mucous membranes are moist.  Neck:     Musculoskeletal: Normal range of motion.  Cardiovascular:     Rate and Rhythm: Normal rate.  Pulmonary:     Effort: Pulmonary effort is normal.     Breath sounds: Normal breath sounds.  Abdominal:     General: Abdomen is flat.     Tenderness: There is abdominal tenderness.  Genitourinary:    General: Normal vulva.     Vagina: Vaginal discharge present.     Comments: Thick white discharge  Cervix nontender Musculoskeletal: Normal range of motion.  Skin:    General: Skin is warm.  Neurological:     General: No focal deficit present.     Mental Status: She is alert.  Psychiatric:  Mood and Affect: Mood normal.      ED Treatments / Results  Labs (all labs ordered are listed, but only abnormal results are displayed) Labs Reviewed  WET PREP, GENITAL - Abnormal; Notable for the following components:      Result Value   Clue Cells Wet Prep HPF POC PRESENT (*)    WBC, Wet Prep HPF POC FEW (*)    All other components within normal limits  URINALYSIS, ROUTINE W REFLEX MICROSCOPIC - Abnormal; Notable for the following components:   Color, Urine STRAW (*)    All other components within normal limits  PREGNANCY, URINE  GC/CHLAMYDIA PROBE AMP ([place]) NOT AT [facility]    EKG None  Radiology US Pelvic Complete W Transvaginal And Torsion R/o  Result Date: 07/25/2018 CLINICAL DATA:  Pelvic pain. EXAM: TRANSABDOMINAL AND TRANSVAGINAL  ULTRASOUND OF PELVIS TECHNIQUE: Both transabdominal and transvaginal ultrasound examinations of the pelvis were performed. Transabdominal technique was performed for global imaging of the pelvis including uterus, ovaries, adnexal regions, and pelvic cul-de-sac. It was necessary to proceed with endovaginal exam following the transabdominal exam to visualize the uterus and ovaries. COMPARISON:  Ultrasound 05/19/2018. FINDINGS: Uterus Measurements: 7.3 x 3.1 x 4.6 cm = volume: 53.6 mL. No fibroids or other mass visualized. Endometrium Thickness: 5.4 mm. Endometrium is slightly thickened and irregular. Active endometrial pathology can not be excluded. Follow-up exam suggested demonstrate resolution. Right ovary Measurements: 4.6 x 2.3 x 2.5 cm = volume: 13.5 mL. Normal appearance/no adnexal mass. Left ovary Measurements: 3.2 x 2.5 x 2.3 cm = volume: 9.5 mL. Normal appearance/no adnexal mass. Other findings No abnormal free fluid. IMPRESSION: Focal slight endometrial thickening and irregularity. Follow-up ultrasound suggested. A focal endometrial lesion can not be excluded. If these changes stone clear on a follow-up ultrasound consider sonohysterogram for further evaluation, prior to hysteroscopy or endometrial biopsy. Electronically Signed   By: Maisie Fus  Register   On: 07/25/2018 09:04    Procedures Procedures (including critical care time)  Medications Ordered in ED Medications  cefTRIAXone (ROCEPHIN) injection 250 mg (250 mg Intramuscular Given 07/25/18 0911)  azithromycin (ZITHROMAX) tablet 1,000 mg (1,000 mg Oral Given 07/25/18 0910)  ondansetron (ZOFRAN-ODT) disintegrating tablet 4 mg (4 mg Oral Given 07/25/18 0910)  ibuprofen (ADVIL,MOTRIN) tablet 600 mg (600 mg Oral Given 07/25/18 0909)  sterile water (preservative free) injection (10 mLs  Given 07/25/18 0911)     Initial Impression / Assessment and Plan / ED Course  I have reviewed the triage vital signs and the nursing notes.  Pertinent labs &  imaging results that were available during my care of the patient were reviewed by me and considered in my medical decision making (see chart for details).    MDM  Pt given Rocephin and zithromax.  Rx for flagyl.  Pt advised to follow up at [facility] for evaluation of endometrial thickening.  An After Visit Summary was printed and given to the patient.   Final Clinical Impressions(s) / ED Diagnoses   Final diagnoses:  Pelvic pain in female  BV (bacterial vaginosis)    ED Discharge Orders         Ordered    metroNIDAZOLE (FLAGYL) 500 MG tablet  2 times daily     07/25/18 0938           Osie Cheeks 07/25/18 1253    Curatolo, Adam, DO 07/25/18 1529

## 2018-07-26 LAB — GC/CHLAMYDIA PROBE AMP (~~LOC~~) NOT AT ARMC
Chlamydia: NEGATIVE
NEISSERIA GONORRHEA: NEGATIVE

## 2018-09-17 ENCOUNTER — Encounter (HOSPITAL_COMMUNITY): Payer: Self-pay

## 2018-09-17 ENCOUNTER — Emergency Department (HOSPITAL_COMMUNITY)
Admission: EM | Admit: 2018-09-17 | Discharge: 2018-09-17 | Disposition: A | Discharge status: Home / Self Care | Payer: Self-pay | Attending: Emergency Medicine | Admitting: Emergency Medicine

## 2018-09-17 ENCOUNTER — Emergency Department (HOSPITAL_COMMUNITY): Payer: Self-pay

## 2018-09-17 ENCOUNTER — Other Ambulatory Visit: Payer: Self-pay

## 2018-09-17 DIAGNOSIS — S01112A Laceration without foreign body of left eyelid and periocular area, initial encounter: Secondary | ICD-10-CM | POA: Insufficient documentation

## 2018-09-17 DIAGNOSIS — Y999 Unspecified external cause status: Secondary | ICD-10-CM | POA: Insufficient documentation

## 2018-09-17 DIAGNOSIS — S0181XA Laceration without foreign body of other part of head, initial encounter: Secondary | ICD-10-CM

## 2018-09-17 DIAGNOSIS — M7918 Myalgia, other site: Secondary | ICD-10-CM

## 2018-09-17 DIAGNOSIS — Y92031 Bathroom in apartment as the place of occurrence of the external cause: Secondary | ICD-10-CM | POA: Insufficient documentation

## 2018-09-17 DIAGNOSIS — S0990XA Unspecified injury of head, initial encounter: Secondary | ICD-10-CM

## 2018-09-17 DIAGNOSIS — M542 Cervicalgia: Secondary | ICD-10-CM | POA: Insufficient documentation

## 2018-09-17 DIAGNOSIS — Y939 Activity, unspecified: Secondary | ICD-10-CM | POA: Insufficient documentation

## 2018-09-17 DIAGNOSIS — M25512 Pain in left shoulder: Secondary | ICD-10-CM | POA: Insufficient documentation

## 2018-09-17 DIAGNOSIS — W01198A Fall on same level from slipping, tripping and stumbling with subsequent striking against other object, initial encounter: Secondary | ICD-10-CM | POA: Insufficient documentation

## 2018-09-17 DIAGNOSIS — Y906 Blood alcohol level of 120-199 mg/100 ml: Secondary | ICD-10-CM | POA: Insufficient documentation

## 2018-09-17 DIAGNOSIS — F10920 Alcohol use, unspecified with intoxication, uncomplicated: Secondary | ICD-10-CM

## 2018-09-17 DIAGNOSIS — F1490 Cocaine use, unspecified, uncomplicated: Secondary | ICD-10-CM | POA: Insufficient documentation

## 2018-09-17 HISTORY — DX: Anemia, unspecified: D64.9

## 2018-09-17 LAB — CBC
HCT: 34.7 % — ABNORMAL LOW (ref 36.0–46.0)
Hemoglobin: 10.2 g/dL — ABNORMAL LOW (ref 12.0–15.0)
MCH: 21.9 pg — ABNORMAL LOW (ref 26.0–34.0)
MCHC: 29.4 g/dL — ABNORMAL LOW (ref 30.0–36.0)
MCV: 74.6 fL — AB (ref 80.0–100.0)
Platelets: 349 10*3/uL (ref 150–400)
RBC: 4.65 MIL/uL (ref 3.87–5.11)
RDW: 17.6 % — ABNORMAL HIGH (ref 11.5–15.5)
WBC: 5.5 10*3/uL (ref 4.0–10.5)
nRBC: 0 % (ref 0.0–0.2)

## 2018-09-17 LAB — COMPREHENSIVE METABOLIC PANEL
ALT: 18 U/L (ref 0–44)
AST: 37 U/L (ref 15–41)
Albumin: 3.6 g/dL (ref 3.5–5.0)
Alkaline Phosphatase: 73 U/L (ref 38–126)
Anion gap: 7 (ref 5–15)
BUN: <5 mg/dL — ABNORMAL LOW (ref 6–20)
CO2: 25 mmol/L (ref 22–32)
Calcium: 8.7 mg/dL — ABNORMAL LOW (ref 8.9–10.3)
Chloride: 107 mmol/L (ref 98–111)
Creatinine, Ser: 0.95 mg/dL (ref 0.44–1.00)
GFR calc Af Amer: >60 mL/min (ref >60)
GFR calc non Af Amer: >60 mL/min (ref >60)
Glucose, Bld: 89 mg/dL (ref 70–99)
POTASSIUM: 3.5 mmol/L (ref 3.5–5.1)
SODIUM: 139 mmol/L (ref 135–145)
Total Bilirubin: 0.4 mg/dL (ref 0.3–1.2)
Total Protein: 7.1 g/dL (ref 6.5–8.1)

## 2018-09-17 LAB — RAPID URINE DRUG SCREEN, HOSP PERFORMED
Amphetamines: NOT DETECTED
Barbiturates: NOT DETECTED
Benzodiazepines: NOT DETECTED
Cocaine: POSITIVE — AB
Opiates: NOT DETECTED
Tetrahydrocannabinol: NOT DETECTED

## 2018-09-17 LAB — ETHANOL: Alcohol, Ethyl (B): 125 mg/dL — ABNORMAL HIGH (ref <10)

## 2018-09-17 LAB — I-STAT BETA HCG BLOOD, ED (MC, WL, AP ONLY): I-stat hCG, quantitative: <5 m[IU]/mL (ref <5)

## 2018-09-17 MED ORDER — SODIUM CHLORIDE 0.9 % IV BOLUS
1000.0000 mL | Freq: Once | INTRAVENOUS | Status: AC
  Administered 2018-09-17: 1000 mL via INTRAVENOUS

## 2018-09-17 MED ORDER — LIDOCAINE HCL 2 % IJ SOLN
10.0000 mL | Freq: Once | INTRAMUSCULAR | Status: AC
  Administered 2018-09-17: 200 mg via INTRADERMAL
  Filled 2018-09-17: qty 20

## 2018-09-17 MED ORDER — FENTANYL CITRATE (PF) 100 MCG/2ML IJ SOLN
50.0000 ug | Freq: Once | INTRAMUSCULAR | Status: AC
  Administered 2018-09-17: 50 ug via INTRAVENOUS
  Filled 2018-09-17: qty 2

## 2018-09-17 NOTE — ED Notes (Signed)
C-collar placed.

## 2018-09-17 NOTE — ED Notes (Signed)
Pt arrived POV with family stating that she had a syncopal episode and hit her head and is going in and out of conciousness, laceration to the left eyebrow bleeding controled. Pt able to minimally assist getting out of vehicle. Family denies hx of seizures, pt eyes rolling continuously.

## 2018-09-17 NOTE — ED Provider Notes (Signed)
Medical screening examination/treatment/procedure(s) were conducted as a shared visit with non-physician practitioner(s) and myself.  I personally evaluated the patient during the encounter. Briefly, the patient is a 23 y.o. female with no significant history who presents to the ED with altered mental status.  Patient had syncopal episode where she hit her head and has laceration to the left side of her face.  Upon my evaluation she is awake and talking and following commands. Appears neuro intact.  She was with a friend today.  She admits to me that she is drinking alcohol and doing drugs.  She admits to using ecstasy.  UDS positive for cocaine as well.  Lab work otherwise unremarkable.  CT of the head, face, neck unremarkable.  Has incidental thyroid cyst.  Will give IV fluids, repair wound and discharge the patient after she metabolizes substances.  Believe that her overall altered mental status is secondary to polysubstance abuse.  This chart was dictated using voice recognition software.  Despite best efforts to proofread,  errors can occur which can change the documentation meaning.     EKG Interpretation  Date/Time:  Tuesday September 17 2018 16:56:42 EDT Ventricular Rate:  97 PR Interval:    QRS Duration: 69 QT Interval:  361 QTC Calculation: 459 R Axis:   74 Text Interpretation:  Sinus rhythm Confirmed by Virgina Norfolk [phone]) on 09/17/2018 5:09:08 PM            Virgina Norfolk, DO 09/17/18 1926

## 2018-09-17 NOTE — ED Notes (Signed)
ED Provider at bedside. 

## 2018-09-17 NOTE — Discharge Instructions (Signed)
Please read and follow all provided instructions.  Your diagnoses today include:  1. Facial laceration, initial encounter   2. Injury of head, initial encounter   3. Alcoholic intoxication without complication (HCC)   4. Musculoskeletal pain     Tests performed today include:  CT scan of your head, face, and neck that did not show any serious injury.  Blood counts electrolytes  Alcohol test -elevated  Vital signs. See below for your results today.   Medications prescribed:   None  Take any prescribed medications only as directed.  Home care instructions:  Follow any educational materials contained in this packet.  Follow-up instructions: Please follow-up with your primary care provider in the next 3-5 days for further evaluation of your symptoms, suture removal.    Return instructions:  SEEK IMMEDIATE MEDICAL ATTENTION IF:  There is confusion or drowsiness (although children frequently become drowsy after injury).   You cannot awaken the injured person.   You have more than one episode of vomiting.   You notice dizziness or unsteadiness which is getting worse, or inability to walk.   You have convulsions or unconsciousness.   You experience severe, persistent headaches not relieved by Tylenol.  You cannot use arms or legs normally.   There are changes in pupil sizes. (This is the black center in the colored part of the eye)   There is clear or bloody discharge from the nose or ears.   You have change in speech, vision, swallowing, or understanding.   Localized weakness, numbness, tingling, or change in bowel or bladder control.  You have any other emergent concerns.  Additional Information: You have had a head injury which does not appear to require admission at this time.  Your vital signs today were: BP 130/76    Pulse 91    Temp (!) 97.5 F (36.4 C) (Oral)    Resp 19    Ht 5\' 5"  (1.651 m)    Wt 61.2 kg    LMP 09/17/2018 (Exact Date)    SpO2 100%     BMI 22.47 kg/m  If your blood pressure (BP) was elevated above 135/85 this visit, please have this repeated by your doctor within one month. --------------

## 2018-09-17 NOTE — ED Triage Notes (Addendum)
Pt brought in by her mom after the pt was found by her friend passed out in the bathroom floor and on the way down she hit her head on the toilet. Has swelling and laceration to left eye brow. This happened around 1500, the pt's mother came home around 1600 and the pt was "talking out of her head and not making sense and her eyes were rolling back in her head" The pt had hx of a fall and head injury in 2007 where she acted like this. Pt also was complaining of left shoulder pain, left flank pain and neck pain yesterday prior to this event

## 2018-09-17 NOTE — ED Provider Notes (Signed)
[facility] EMERGENCY DEPARTMENT Provider Note   CSN: [id number] Arrival date & time: 09/17/18  1631    History   Chief Complaint Chief Complaint  Patient presents with   Altered Mental Status    HPI Emily Yu is a 23 y.o. female.     Patient with history of chronic headaches, painful heavy periods, anemia --presents the emergency department with altered mental status, syncope, fall.  Patient was with her boyfriend at approximately 3:30 PM today.  Boyfriend reports that he went outside from being inside and patient was hard to keep awake.  He helped her inside.  She would wake up with fall right back asleep.  At one point she tried to get up to go to the bathroom.  She fell to the ground and struck her left forehead on the toilet causing a laceration.  Patient has frequent headaches and has been complaining more about these recently.  She also recently started her menstrual period.  Family reports that she had significant left-sided neck pain and would not let them touch the area prior to arrival.  Level 5 caveat due to altered mental status.  Additional information from family --patient was using ecstasy and drinking alcohol prior to onset of symptoms.     Past Medical History:  Diagnosis Date   Anemia     There are no active problems to display for this patient.   Past Surgical History:  Procedure Laterality Date   WRIST SURGERY       OB History   No obstetric history on file.      Home Medications    Prior to Admission medications   Medication Sig Start Date End Date Taking? Authorizing Provider  ferrous sulfate 325 (65 FE) MG tablet Take 325 mg by mouth as needed (menstural).    [provider]  ibuprofen (ADVIL,MOTRIN) 600 MG tablet Take 1 tablet (600 mg total) by mouth every 6 (six) hours as needed. Patient not taking: Reported on 07/25/2018 05/19/18   McDonald, Pedro Earls A, PA-C  metroNIDAZOLE (FLAGYL) 500 MG tablet Take 1 tablet  (500 mg total) by mouth 2 (two) times daily. 07/25/18   Elson Areas, PA-C    Family History History reviewed. No pertinent family history.  Social History Social History   Tobacco Use   Smoking status: Never Smoker   Smokeless tobacco: Never Used  Substance Use Topics   Alcohol use: Yes   Drug use: Yes    Types: Marijuana    Comment: occ     Allergies   Lactose intolerance (gi)   Review of Systems Review of Systems  Unable to perform ROS: Mental status change     Physical Exam Updated Vital Signs BP 135/89 (BP Location: Right Arm)    Pulse (!) 106    Temp (!) 97.5 F (36.4 C) (Oral)    Resp 14    Ht 5\' 5"  (1.651 m)    Wt 61.2 kg    LMP 09/17/2018 (Exact Date)    SpO2 100%    BMI 22.47 kg/m   Physical Exam Vitals signs and nursing note reviewed.  Constitutional:      Appearance: She is well-developed.  HENT:     Head: Normocephalic and atraumatic. No raccoon eyes or Battle's sign.     Comments: 2 cm, clean, hemostatic, minimally gaping laceration at the lateral aspect of the left upper eyelid.    Right Ear: Tympanic membrane, ear canal and external ear normal. No hemotympanum.  Left Ear: Tympanic membrane, ear canal and external ear normal. No hemotympanum.     Nose: Nose normal.     Mouth/Throat:     Pharynx: Uvula midline.  Eyes:     General: Lids are normal.        Right eye: No discharge.        Left eye: No discharge.     Extraocular Movements:     Right eye: No nystagmus.     Left eye: No nystagmus.     Conjunctiva/sclera: Conjunctivae normal.     Pupils: Pupils are equal, round, and reactive to light.     Comments: No visible hyphema noted  Neck:     Musculoskeletal: Normal range of motion and neck supple.  Cardiovascular:     Rate and Rhythm: Normal rate and regular rhythm.     Pulses:          Radial pulses are 2+ on the right side and 2+ on the left side.       Dorsalis pedis pulses are 2+ on the right side and 2+ on the left side.      Heart sounds: Normal heart sounds.  Pulmonary:     Effort: Pulmonary effort is normal.     Breath sounds: Normal breath sounds.  Abdominal:     Palpations: Abdomen is soft.     Tenderness: There is no abdominal tenderness.  Musculoskeletal:     Right shoulder: She exhibits normal range of motion, no tenderness and no bony tenderness.     Left shoulder: She exhibits tenderness. She exhibits normal range of motion and no bony tenderness.     Cervical back: She exhibits tenderness. She exhibits normal range of motion and no bony tenderness.     Thoracic back: She exhibits no tenderness and no bony tenderness.     Lumbar back: She exhibits no tenderness and no bony tenderness.       Back:  Skin:    General: Skin is warm and dry.  Neurological:     Mental Status: She is alert. She is disoriented.     GCS: GCS eye subscore is 3. GCS verbal subscore is 4. GCS motor subscore is 6.     Cranial Nerves: No cranial nerve deficit.     Deep Tendon Reflexes: Reflexes are normal and symmetric.     Comments: Patient awakes to speech.  She is crying and moaning softly, nonconversant.  She will follow basic commands such as open your eyes, stick out her tongue, squeeze my fingers.      ED Treatments / Results  Labs (all labs ordered are listed, but only abnormal results are displayed) Labs Reviewed  CBC - Abnormal; Notable for the following components:      Result Value   Hemoglobin 10.2 (*)    HCT 34.7 (*)    MCV 74.6 (*)    MCH 21.9 (*)    MCHC 29.4 (*)    RDW 17.6 (*)    All other components within normal limits  RAPID URINE DRUG SCREEN, HOSP PERFORMED - Abnormal; Notable for the following components:   Cocaine POSITIVE (*)    All other components within normal limits  COMPREHENSIVE METABOLIC PANEL - Abnormal; Notable for the following components:   BUN <5 (*)    Calcium 8.7 (*)    All other components within normal limits  ETHANOL - Abnormal; Notable for the following components:    Alcohol, Ethyl (B) 125 (*)    All  other components within normal limits  I-STAT BETA HCG BLOOD, ED ([facility], [facility], [facility] ONLY)    EKG EKG Interpretation  Date/Time:  Tuesday September 17 2018 16:56:42 EDT Ventricular Rate:  97 PR Interval:    QRS Duration: 69 QT Interval:  361 QTC Calculation: 459 R Axis:   74 Text Interpretation:  Sinus rhythm Confirmed by Virgina Norfolk [phone]) on 09/17/2018 5:09:08 PM   Radiology Ct Head Wo Contrast  Result Date: 09/17/2018 CLINICAL DATA:  Syncopal episode, head injury and laceration to left supraorbital region. EXAM: CT HEAD WITHOUT CONTRAST CT MAXILLOFACIAL WITHOUT CONTRAST CT CERVICAL SPINE WITHOUT CONTRAST TECHNIQUE: Multidetector CT imaging of the head, cervical spine, and maxillofacial structures were performed using the standard protocol without intravenous contrast. Multiplanar CT image reconstructions of the cervical spine and maxillofacial structures were also generated. COMPARISON:  Prior studies on 05/08/2017 FINDINGS: CT HEAD FINDINGS Brain: No evidence of acute infarction, hemorrhage, hydrocephalus, extra-axial collection or mass lesion/mass effect. Vascular: No hyperdense vessel or unexpected calcification. Skull: Normal. Negative for fracture or focal lesion. Other: None. CT MAXILLOFACIAL FINDINGS Osseous: No evidence of acute fracture. The nasal septum is deviated to the left. Orbits: No orbital injuries identified. Sinuses: Mucosal thickening present within bilateral ethmoid air cells. Soft tissues: No soft tissue abnormalities or foreign body. CT CERVICAL SPINE FINDINGS Alignment: The cervical spine demonstrates normal alignment. Skull base and vertebrae: No fracture or bony lesion identified. Soft tissues and spinal canal: No soft tissue swelling. Incidental cyst versus nodule in the left lobe of the thyroid gland measures roughly 1.5-2 cm. This was present previously by CT. Disc levels:  No evidence of degenerative disc disease. Upper chest: Negative.  IMPRESSION: 1. Normal head CT. 2. No evidence of acute maxillofacial fractures. Ethmoid sinus mucosal thickening. 3. No evidence of acute cervical injury. 4. Incidental cyst versus nodule in the left lobe of the thyroid gland measuring roughly 1.5-2 cm. Electronically Signed   By: Irish Lack M.D.   On: 09/17/2018 19:06   Ct Cervical Spine Wo Contrast  Result Date: 09/17/2018 CLINICAL DATA:  Syncopal episode, head injury and laceration to left supraorbital region. EXAM: CT HEAD WITHOUT CONTRAST CT MAXILLOFACIAL WITHOUT CONTRAST CT CERVICAL SPINE WITHOUT CONTRAST TECHNIQUE: Multidetector CT imaging of the head, cervical spine, and maxillofacial structures were performed using the standard protocol without intravenous contrast. Multiplanar CT image reconstructions of the cervical spine and maxillofacial structures were also generated. COMPARISON:  Prior studies on 05/08/2017 FINDINGS: CT HEAD FINDINGS Brain: No evidence of acute infarction, hemorrhage, hydrocephalus, extra-axial collection or mass lesion/mass effect. Vascular: No hyperdense vessel or unexpected calcification. Skull: Normal. Negative for fracture or focal lesion. Other: None. CT MAXILLOFACIAL FINDINGS Osseous: No evidence of acute fracture. The nasal septum is deviated to the left. Orbits: No orbital injuries identified. Sinuses: Mucosal thickening present within bilateral ethmoid air cells. Soft tissues: No soft tissue abnormalities or foreign body. CT CERVICAL SPINE FINDINGS Alignment: The cervical spine demonstrates normal alignment. Skull base and vertebrae: No fracture or bony lesion identified. Soft tissues and spinal canal: No soft tissue swelling. Incidental cyst versus nodule in the left lobe of the thyroid gland measures roughly 1.5-2 cm. This was present previously by CT. Disc levels:  No evidence of degenerative disc disease. Upper chest: Negative. IMPRESSION: 1. Normal head CT. 2. No evidence of acute maxillofacial fractures.  Ethmoid sinus mucosal thickening. 3. No evidence of acute cervical injury. 4. Incidental cyst versus nodule in the left lobe of the thyroid gland measuring roughly 1.5-2 cm.  Electronically Signed   By: Irish Lack M.D.   On: 09/17/2018 19:06   Ct Maxillofacial Wo Contrast  Result Date: 09/17/2018 CLINICAL DATA:  Syncopal episode, head injury and laceration to left supraorbital region. EXAM: CT HEAD WITHOUT CONTRAST CT MAXILLOFACIAL WITHOUT CONTRAST CT CERVICAL SPINE WITHOUT CONTRAST TECHNIQUE: Multidetector CT imaging of the head, cervical spine, and maxillofacial structures were performed using the standard protocol without intravenous contrast. Multiplanar CT image reconstructions of the cervical spine and maxillofacial structures were also generated. COMPARISON:  Prior studies on 05/08/2017 FINDINGS: CT HEAD FINDINGS Brain: No evidence of acute infarction, hemorrhage, hydrocephalus, extra-axial collection or mass lesion/mass effect. Vascular: No hyperdense vessel or unexpected calcification. Skull: Normal. Negative for fracture or focal lesion. Other: None. CT MAXILLOFACIAL FINDINGS Osseous: No evidence of acute fracture. The nasal septum is deviated to the left. Orbits: No orbital injuries identified. Sinuses: Mucosal thickening present within bilateral ethmoid air cells. Soft tissues: No soft tissue abnormalities or foreign body. CT CERVICAL SPINE FINDINGS Alignment: The cervical spine demonstrates normal alignment. Skull base and vertebrae: No fracture or bony lesion identified. Soft tissues and spinal canal: No soft tissue swelling. Incidental cyst versus nodule in the left lobe of the thyroid gland measures roughly 1.5-2 cm. This was present previously by CT. Disc levels:  No evidence of degenerative disc disease. Upper chest: Negative. IMPRESSION: 1. Normal head CT. 2. No evidence of acute maxillofacial fractures. Ethmoid sinus mucosal thickening. 3. No evidence of acute cervical injury. 4.  Incidental cyst versus nodule in the left lobe of the thyroid gland measuring roughly 1.5-2 cm. Electronically Signed   By: Irish Lack M.D.   On: 09/17/2018 19:06    Procedures Procedures (including critical care time)  Medications Ordered in ED Medications  sodium chloride 0.9 % bolus 1,000 mL (0 mLs Intravenous Stopped 09/17/18 1849)  fentaNYL (SUBLIMAZE) injection 50 mcg (50 mcg Intravenous Given 09/17/18 1748)  sodium chloride 0.9 % bolus 1,000 mL (0 mLs Intravenous Stopped 09/17/18 1945)  lidocaine (XYLOCAINE) 2 % (with pres) injection 200 mg (200 mg Intradermal Given 09/17/18 1944)     Initial Impression / Assessment and Plan / ED Course  I have reviewed the triage vital signs and the nursing notes.  Pertinent labs & imaging results that were available during my care of the patient were reviewed by me and considered in my medical decision making (see chart for details).        Patient seen and examined. Work-up initiated. Medications ordered. C-collar placed. Pt discussed with Dr. Lockie Mola.   Vital signs reviewed and are as follows: BP 135/87    Pulse (!) 109    Temp (!) 97.5 F (36.4 C) (Oral)    Resp 16    Ht 5\' 5"  (1.651 m)    Wt 61.2 kg    LMP 09/17/2018 (Exact Date)    SpO2 100%    BMI 22.47 kg/m   Patient seen previously by Dr. Lockie Mola.  History obtained from additional family, use of ecstasy and alcohol reported.  During ED stay, patient has become more coherent and talkative.  She has ambulated without difficulty.  She is returning to her baseline.  Wound repair as above.  Patient and family updated on imaging results.  C-collar removed by myself.  Patient counseled on wound care. Patient counseled on need to return or see PCP/urgent care for suture removal in 3-5 days. Patient was urged to return to the Emergency Department urgently with worsening pain, swelling, expanding erythema especially if  it streaks away from the affected area, fever, or if they have any  other concerns. Patient verbalized understanding.   Patient was counseled on head injury precautions and symptoms that should indicate their return to the ED.  These include severe worsening headache, vision changes, confusion, loss of consciousness, trouble walking, nausea & vomiting, or weakness/tingling in extremities.      Final Clinical Impressions(s) / ED Diagnoses   Final diagnoses:  Facial laceration, initial encounter  Injury of head, initial encounter  Alcoholic intoxication without complication [facility])  Musculoskeletal pain   Patient with altered mental status likely secondary to polysubstance abuse including alcohol.  Patient has returned to her baseline during ED stay.  Imaging of the head is negative.  No fevers or other signs and symptoms of meningitis.  Patient eating and drinking well.  Ambulating without any ataxia.  Facial laceration: Repaired without complication.  No deep structure involvement suspected.  Wound is superficial.  Neck and shoulder pain: Imaging negative of the cervical spine.  Likely musculoskeletal in nature related to her fall.  No neurological deficits noted.   ED Discharge Orders    None       Renne Crigler, Cordelia Poche 09/17/18 2109    Virgina Norfolk, DO 09/18/18 [phone]

## 2018-09-17 NOTE — ED Notes (Signed)
Pt returned from CT °

## 2019-01-22 ENCOUNTER — Emergency Department (HOSPITAL_BASED_OUTPATIENT_CLINIC_OR_DEPARTMENT_OTHER)
Admission: EM | Admit: 2019-01-22 | Discharge: 2019-01-22 | Disposition: A | Discharge status: Home / Self Care | Payer: Medicaid Other | Attending: Emergency Medicine | Admitting: Emergency Medicine

## 2019-01-22 ENCOUNTER — Other Ambulatory Visit: Payer: Self-pay

## 2019-01-22 ENCOUNTER — Encounter (HOSPITAL_BASED_OUTPATIENT_CLINIC_OR_DEPARTMENT_OTHER): Payer: Self-pay | Admitting: Emergency Medicine

## 2019-01-22 DIAGNOSIS — Z202 Contact with and (suspected) exposure to infections with a predominantly sexual mode of transmission: Secondary | ICD-10-CM | POA: Insufficient documentation

## 2019-01-22 DIAGNOSIS — N72 Inflammatory disease of cervix uteri: Secondary | ICD-10-CM

## 2019-01-22 DIAGNOSIS — A64 Unspecified sexually transmitted disease: Secondary | ICD-10-CM

## 2019-01-22 DIAGNOSIS — F17290 Nicotine dependence, other tobacco product, uncomplicated: Secondary | ICD-10-CM | POA: Insufficient documentation

## 2019-01-22 LAB — URINALYSIS, ROUTINE W REFLEX MICROSCOPIC
Bilirubin Urine: NEGATIVE
Glucose, UA: NEGATIVE mg/dL
Hgb urine dipstick: NEGATIVE
Ketones, ur: NEGATIVE mg/dL
Leukocytes,Ua: NEGATIVE
Nitrite: NEGATIVE
Protein, ur: NEGATIVE mg/dL
Specific Gravity, Urine: 1.01 (ref 1.005–1.030)
pH: 7.5 (ref 5.0–8.0)

## 2019-01-22 LAB — WET PREP, GENITAL
Sperm: NONE SEEN
Trich, Wet Prep: NONE SEEN
Yeast Wet Prep HPF POC: NONE SEEN

## 2019-01-22 LAB — PREGNANCY, URINE: Preg Test, Ur: NEGATIVE

## 2019-01-22 MED ORDER — CEFTRIAXONE SODIUM 250 MG IJ SOLR
250.0000 mg | Freq: Once | INTRAMUSCULAR | Status: AC
  Administered 2019-01-22: 250 mg via INTRAMUSCULAR
  Filled 2019-01-22: qty 250

## 2019-01-22 MED ORDER — NAPROXEN 250 MG PO TABS
500.0000 mg | ORAL_TABLET | Freq: Once | ORAL | Status: AC
  Administered 2019-01-22: 500 mg via ORAL
  Filled 2019-01-22: qty 2

## 2019-01-22 MED ORDER — DOXYCYCLINE HYCLATE 100 MG PO CAPS: 100.0000 mg | ORAL_CAPSULE | Freq: Two times a day (BID) | ORAL | 0 refills | Status: DC

## 2019-01-22 MED ORDER — NAPROXEN 375 MG PO TABS: 375.0000 mg | ORAL_TABLET | Freq: Two times a day (BID) | ORAL | 0 refills | Status: DC

## 2019-01-22 MED ORDER — AZITHROMYCIN 1 G PO PACK
1.0000 g | PACK | Freq: Once | ORAL | Status: AC
  Administered 2019-01-22: 1 g via ORAL
  Filled 2019-01-22: qty 1

## 2019-01-22 MED ORDER — DICYCLOMINE HCL 10 MG PO CAPS
10.0000 mg | ORAL_CAPSULE | Freq: Once | ORAL | Status: AC
  Administered 2019-01-22: 10 mg via ORAL
  Filled 2019-01-22: qty 1

## 2019-01-22 MED ORDER — ACETAMINOPHEN 500 MG PO TABS
1000.0000 mg | ORAL_TABLET | Freq: Once | ORAL | Status: AC
  Administered 2019-01-22: 1000 mg via ORAL
  Filled 2019-01-22: qty 2

## 2019-01-22 NOTE — ED Provider Notes (Signed)
[facility] EMERGENCY DEPARTMENT Provider Note   CSN: [id number] Arrival date & time: 01/22/19  0010     History   Chief Complaint Chief Complaint  Patient presents with  . Pelvic Pain    HPI Emily RighterKyla S Yu is a 23 y.o. female.     The history is provided by the patient.  Pelvic Pain This is a recurrent problem. The current episode started more than 1 week ago. The problem has not changed since onset.Pertinent negatives include no chest pain, no abdominal pain, no headaches and no shortness of breath. Nothing aggravates the symptoms. Nothing relieves the symptoms. She has tried nothing for the symptoms. The treatment provided no relief.  Patient states she has had severe pelvic pain for more than a week the pain is Bilateral sharp and ongoing.  She states she has one new partner and has had unprotected encounters.  She denies dysuria, hematuria flank pain.  No n/v/d.  Denies COVID exposures.  She has not tried anything for pain.  States last BM was yesterday.    Past Medical History:  Diagnosis Date  . Anemia     There are no active problems to display for this patient.   Past Surgical History:  Procedure Laterality Date  . WRIST SURGERY       OB History   No obstetric history on file.      Home Medications    Prior to Admission medications   Medication Sig Start Date End Date Taking? Authorizing Provider  doxycycline (VIBRAMYCIN) 100 MG capsule Take 1 capsule (100 mg total) by mouth 2 (two) times daily. One po bid x 7 days 01/22/19   Cy BlamerPalumbo, Emersen Mascari, MD    Family History History reviewed. No pertinent family history.  Social History Social History   Tobacco Use  . Smoking status: Current Some Day Smoker    Types: E-cigarettes  . Smokeless tobacco: Never Used  Substance Use Topics  . Alcohol use: Yes  . Drug use: Yes    Types: Marijuana    Comment: occ     Allergies   Lactose intolerance (gi)   Review of Systems Review of Systems   Constitutional: Negative for fever.  Eyes: Negative for photophobia and visual disturbance.  Respiratory: Negative for shortness of breath.   Cardiovascular: Negative for chest pain.  Gastrointestinal: Negative for abdominal pain, blood in stool, diarrhea, nausea and vomiting.  Genitourinary: Positive for pelvic pain and vaginal discharge. Negative for dysuria, flank pain, frequency and genital sores.  Musculoskeletal: Negative for neck pain and neck stiffness.  Neurological: Negative for headaches.  All other systems reviewed and are negative.    Physical Exam Updated Vital Signs BP 132/74 (BP Location: Right Arm)   Pulse 74   Temp 98.4 F (36.9 C) (Oral)   Resp 16   Ht 5\' 4"  (1.626 m)   Wt 72.1 kg   LMP 01/06/2019 (Exact Date)   SpO2 98%   BMI 27.29 kg/m   Physical Exam Vitals signs and nursing note reviewed. Exam conducted with a chaperone present.  Constitutional:      General: She is not in acute distress.    Appearance: She is not ill-appearing.     Comments: Patient is texting on phone eating flaming hot cheetos, candy and cookies and drinking soda   HENT:     Head: Normocephalic and atraumatic.     Nose: Nose normal.     Mouth/Throat:     Mouth: Mucous membranes are moist.  Pharynx: Oropharynx is clear.  Eyes:     Conjunctiva/sclera: Conjunctivae normal.     Pupils: Pupils are equal, round, and reactive to light.  Neck:     Musculoskeletal: Normal range of motion and neck supple. No neck rigidity.  Cardiovascular:     Rate and Rhythm: Normal rate and regular rhythm.     Pulses: Normal pulses.     Heart sounds: Normal heart sounds.  Pulmonary:     Effort: Pulmonary effort is normal.     Breath sounds: Normal breath sounds.  Abdominal:     General: Abdomen is flat. Bowel sounds are increased.     Palpations: Abdomen is soft.     Tenderness: There is no abdominal tenderness. There is no guarding or rebound. Negative signs include Murphy's sign, Rovsing's  sign and McBurney's sign.     Hernia: No hernia is present.  Genitourinary:    Exam position: Lithotomy position.     Cervix: Cervical motion tenderness and discharge present.     Uterus: Tender.      Adnexa:        Right: No mass.         Left: No mass.       Comments: Discharge yellow/green copious Musculoskeletal: Normal range of motion.  Lymphadenopathy:     Cervical: No cervical adenopathy.  Skin:    General: Skin is warm and dry.     Capillary Refill: Capillary refill takes less than 2 seconds.  Neurological:     General: No focal deficit present.     Mental Status: She is alert and oriented to person, place, and time.  Psychiatric:        Mood and Affect: Mood normal.        Behavior: Behavior normal.      ED Treatments / Results  Labs (all labs ordered are listed, but only abnormal results are displayed) Results for orders placed or performed during the hospital encounter of 01/22/19  Wet prep, genital  Result Value Ref Range   Yeast Wet Prep HPF POC NONE SEEN NONE SEEN   Trich, Wet Prep NONE SEEN NONE SEEN   Clue Cells Wet Prep HPF POC PRESENT (A) NONE SEEN   WBC, Wet Prep HPF POC MANY (A) NONE SEEN   Sperm NONE SEEN   Pregnancy, urine  Result Value Ref Range   Preg Test, Ur NEGATIVE NEGATIVE  Urinalysis, Routine w reflex microscopic  Result Value Ref Range   Color, Urine STRAW (A) YELLOW   APPearance CLEAR CLEAR   Specific Gravity, Urine 1.010 1.005 - 1.030   pH 7.5 5.0 - 8.0   Glucose, UA NEGATIVE NEGATIVE mg/dL   Hgb urine dipstick NEGATIVE NEGATIVE   Bilirubin Urine NEGATIVE NEGATIVE   Ketones, ur NEGATIVE NEGATIVE mg/dL   Protein, ur NEGATIVE NEGATIVE mg/dL   Nitrite NEGATIVE NEGATIVE   Leukocytes,Ua NEGATIVE NEGATIVE   No results found.  EKG None  Radiology No results found.  Procedures Procedures (including critical care time)  Medications Ordered in ED Medications  acetaminophen (TYLENOL) tablet 1,000 mg (1,000 mg Oral Given 01/22/19  0038)  naproxen (NAPROSYN) tablet 500 mg (500 mg Oral Given 01/22/19 0038)  dicyclomine (BENTYL) capsule 10 mg (10 mg Oral Given 01/22/19 0038)  cefTRIAXone (ROCEPHIN) injection 250 mg (250 mg Intramuscular Given 01/22/19 0221)  azithromycin (ZITHROMAX) powder 1 g (1 g Oral Given 01/22/19 0219)     Will treat for PID, have informed patient no sexual contact until 7 days after all  partners treated.  No indication for imaging at this time as vitals and abdominal exam are benign and not consistent with surgical abdomen.  Moreover, patient is tolerating POs in room in the form of flaming hot cheetos and cookies.  Stool is palpable throughout and I have advised bowel regimen.  Doxycycline sent to patient's pharmacy and patient is informed to take all 14 days worth.    Final Clinical Impressions(s) / ED Diagnoses   Final diagnoses:  Cervicitis  STI (sexually transmitted infection)   Return for intractable cough, coughing up blood,fevers >100.4 unrelieved by medication, shortness of breath, intractable vomiting, chest pain, shortness of breath, weakness,numbness, changes in speech, facial asymmetry,abdominal pain, passing out,Inability to tolerate liquids or food, cough, altered mental status or any concerns. No signs of systemic illness or infection. The patient is nontoxic-appearing on exam and vital signs are within normal limits.   I have reviewed the triage vital signs and the nursing notes. Pertinent labs &imaging results that were available during my care of the patient were reviewed by me and considered in my medical decision making (see chart for details).  After history, exam, and medical workup I feel the patient has been appropriately medically screened and is safe for discharge home. Pertinent diagnoses were discussed with the patient. Patient was given return precautions ED Discharge Orders         Ordered    doxycycline (VIBRAMYCIN) 100 MG capsule  2 times daily     01/22/19  0222           Junior Kenedy, MD 01/22/19 5456

## 2019-01-22 NOTE — ED Triage Notes (Signed)
Patient arrived via POV c/o left sided pain radiating into lower back. Patient endorses pain on right side as well.x 1 week. Patient states 10/10 pain. Patient is AO x 4, gait normal, VS WDL.

## 2019-01-23 LAB — GC/CHLAMYDIA PROBE AMP (~~LOC~~) NOT AT ARMC
Chlamydia: NEGATIVE
Neisseria Gonorrhea: NEGATIVE

## 2019-02-06 ENCOUNTER — Ambulatory Visit (HOSPITAL_COMMUNITY)
Admission: EM | Admit: 2019-02-06 | Discharge: 2019-02-06 | Discharge status: Against Medical Advice | Payer: Medicaid Other

## 2019-02-06 ENCOUNTER — Emergency Department (HOSPITAL_COMMUNITY): Payer: Self-pay

## 2019-02-06 ENCOUNTER — Other Ambulatory Visit: Payer: Self-pay

## 2019-02-06 ENCOUNTER — Emergency Department (HOSPITAL_COMMUNITY)
Admission: EM | Admit: 2019-02-06 | Discharge: 2019-02-06 | Disposition: A | Discharge status: Home / Self Care | Payer: Self-pay | Attending: Emergency Medicine | Admitting: Emergency Medicine

## 2019-02-06 DIAGNOSIS — Y929 Unspecified place or not applicable: Secondary | ICD-10-CM | POA: Insufficient documentation

## 2019-02-06 DIAGNOSIS — Y939 Activity, unspecified: Secondary | ICD-10-CM | POA: Insufficient documentation

## 2019-02-06 DIAGNOSIS — Y999 Unspecified external cause status: Secondary | ICD-10-CM | POA: Insufficient documentation

## 2019-02-06 DIAGNOSIS — F17290 Nicotine dependence, other tobacco product, uncomplicated: Secondary | ICD-10-CM | POA: Insufficient documentation

## 2019-02-06 DIAGNOSIS — S161XXA Strain of muscle, fascia and tendon at neck level, initial encounter: Secondary | ICD-10-CM | POA: Insufficient documentation

## 2019-02-06 DIAGNOSIS — M79642 Pain in left hand: Secondary | ICD-10-CM | POA: Insufficient documentation

## 2019-02-06 DIAGNOSIS — Z79899 Other long term (current) drug therapy: Secondary | ICD-10-CM | POA: Insufficient documentation

## 2019-02-06 DIAGNOSIS — T7411XA Adult physical abuse, confirmed, initial encounter: Secondary | ICD-10-CM | POA: Insufficient documentation

## 2019-02-06 DIAGNOSIS — Z23 Encounter for immunization: Secondary | ICD-10-CM | POA: Insufficient documentation

## 2019-02-06 MED ORDER — TETANUS-DIPHTH-ACELL PERTUSSIS 5-2.5-18.5 LF-MCG/0.5 IM SUSP
0.5000 mL | Freq: Once | INTRAMUSCULAR | Status: AC
  Administered 2019-02-06: 17:00:00 0.5 mL via INTRAMUSCULAR
  Filled 2019-02-06: qty 0.5

## 2019-02-06 MED ORDER — OXYCODONE-ACETAMINOPHEN 5-325 MG PO TABS
1.0000 | ORAL_TABLET | Freq: Once | ORAL | Status: AC
  Administered 2019-02-06: 1 via ORAL
  Filled 2019-02-06: qty 1

## 2019-02-06 NOTE — Discharge Instructions (Addendum)
Please read the attached information.  You will need repeat x-ray of her hand and approximately 2 weeks.  Please use splint as instructed.  Please return if you develop any new or worsening signs or symptoms.
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
## 2019-02-06 NOTE — ED Triage Notes (Signed)
Per pt access, pt left for ER

## 2019-03-26 ENCOUNTER — Encounter (HOSPITAL_BASED_OUTPATIENT_CLINIC_OR_DEPARTMENT_OTHER): Payer: Self-pay | Admitting: Emergency Medicine

## 2019-03-26 ENCOUNTER — Emergency Department (HOSPITAL_BASED_OUTPATIENT_CLINIC_OR_DEPARTMENT_OTHER)
Admission: EM | Admit: 2019-03-26 | Discharge: 2019-03-27 | Disposition: A | Discharge status: Home / Self Care | Payer: Medicaid Other | Attending: Emergency Medicine | Admitting: Emergency Medicine

## 2019-03-26 ENCOUNTER — Other Ambulatory Visit: Payer: Self-pay

## 2019-03-26 DIAGNOSIS — K2970 Gastritis, unspecified, without bleeding: Secondary | ICD-10-CM | POA: Insufficient documentation

## 2019-03-26 DIAGNOSIS — K2900 Acute gastritis without bleeding: Secondary | ICD-10-CM

## 2019-03-26 DIAGNOSIS — Z87891 Personal history of nicotine dependence: Secondary | ICD-10-CM | POA: Insufficient documentation

## 2019-03-26 DIAGNOSIS — Z791 Long term (current) use of non-steroidal anti-inflammatories (NSAID): Secondary | ICD-10-CM | POA: Insufficient documentation

## 2019-03-26 DIAGNOSIS — K5900 Constipation, unspecified: Secondary | ICD-10-CM | POA: Insufficient documentation

## 2019-03-26 NOTE — ED Triage Notes (Signed)
Pt is c/o vomiting since yesterday states has not attempted to eat or drink much today Denies diarrhea  States she feels weak all over

## 2019-03-27 ENCOUNTER — Encounter (HOSPITAL_BASED_OUTPATIENT_CLINIC_OR_DEPARTMENT_OTHER): Payer: Self-pay | Admitting: Emergency Medicine

## 2019-03-27 ENCOUNTER — Emergency Department (HOSPITAL_BASED_OUTPATIENT_CLINIC_OR_DEPARTMENT_OTHER): Payer: Medicaid Other

## 2019-03-27 LAB — URINALYSIS, ROUTINE W REFLEX MICROSCOPIC
Bilirubin Urine: NEGATIVE
Glucose, UA: NEGATIVE mg/dL
Hgb urine dipstick: NEGATIVE
Ketones, ur: NEGATIVE mg/dL
Leukocytes,Ua: NEGATIVE
Nitrite: NEGATIVE
Protein, ur: NEGATIVE mg/dL
Specific Gravity, Urine: 1.015 (ref 1.005–1.030)
pH: 7 (ref 5.0–8.0)

## 2019-03-27 LAB — PREGNANCY, URINE: Preg Test, Ur: NEGATIVE

## 2019-03-27 MED ORDER — POLYETHYLENE GLYCOL 3350 17 G PO PACK: 17.0000 g | PACK | Freq: Every day | ORAL | 0 refills | Status: DC

## 2019-03-27 MED ORDER — ALUM & MAG HYDROXIDE-SIMETH 200-200-20 MG/5ML PO SUSP
30.0000 mL | Freq: Once | ORAL | Status: AC
  Administered 2019-03-27: 30 mL via ORAL
  Filled 2019-03-27: qty 30

## 2019-03-27 MED ORDER — HYOSCYAMINE SULFATE 0.125 MG SL SUBL
0.2500 mg | SUBLINGUAL_TABLET | Freq: Once | SUBLINGUAL | Status: AC
  Administered 2019-03-27: 0.25 mg via SUBLINGUAL
  Filled 2019-03-27: qty 2

## 2019-03-27 MED ORDER — FAMOTIDINE 20 MG PO TABS: 20.0000 mg | ORAL_TABLET | Freq: Two times a day (BID) | ORAL | 0 refills | Status: DC

## 2019-03-27 MED ORDER — LIDOCAINE VISCOUS HCL 2 % MT SOLN
15.0000 mL | Freq: Once | OROMUCOSAL | Status: AC
  Administered 2019-03-27: 15 mL via ORAL
  Filled 2019-03-27: qty 15

## 2019-03-27 MED ORDER — ACETAMINOPHEN 500 MG PO TABS
1000.0000 mg | ORAL_TABLET | Freq: Once | ORAL | Status: AC
  Administered 2019-03-27: 01:00:00 1000 mg via ORAL
  Filled 2019-03-27: qty 2

## 2019-03-27 MED ORDER — ONDANSETRON 8 MG PO TBDP
8.0000 mg | ORAL_TABLET | Freq: Once | ORAL | Status: AC
  Administered 2019-03-27: 8 mg via ORAL
  Filled 2019-03-27: qty 1

## 2019-03-27 MED ORDER — DICYCLOMINE HCL 20 MG PO TABS: 20.0000 mg | ORAL_TABLET | Freq: Two times a day (BID) | ORAL | 0 refills | Status: DC

## 2019-03-27 MED ORDER — ONDANSETRON 8 MG PO TBDP: ORAL_TABLET | ORAL | 0 refills | Status: DC

## 2019-03-27 NOTE — ED Notes (Signed)
Pt states onset vomiting yesterday afternoon, irregular BM. States abd pain began after episodes of vomiting which began after eating chinese/sushi. [facility] Sep3-9th.

## 2019-03-27 NOTE — ED Provider Notes (Signed)
[name] EMERGENCY DEPARTMENT Provider Note   CSN: [id number] Arrival date & time: 03/26/19  2327     History   Chief Complaint Chief Complaint  Patient presents with  . Emesis    HPI Emily Yu is a 23 y.o. female.     The history is provided by the patient.  Emesis Severity:  Moderate Duration:  2 days Timing:  Sporadic Quality:  Stomach contents Able to tolerate:  Liquids Progression:  Unchanged Chronicity:  Recurrent Context: not post-tussive   Relieved by:  Nothing Worsened by:  Nothing Ineffective treatments:  None tried Associated symptoms: no arthralgias, no cough, no diarrhea and no fever   Associated symptoms comment:  Constipation and cramping Risk factors: suspect food intake   Risk factors comment:  Ate [place] food and started vomiting soon after.  Vomiting started a day before the pain.     Past Medical History:  Diagnosis Date  . Anemia     There are no active problems to display for this patient.   Past Surgical History:  Procedure Laterality Date  . WRIST SURGERY       OB History   No obstetric history on file.      Home Medications    Prior to Admission medications   Medication Sig Start Date End Date Taking? Authorizing Provider  doxycycline (VIBRAMYCIN) 100 MG capsule Take 1 capsule (100 mg total) by mouth 2 (two) times daily. One po bid x 7 days 01/22/19   Khaleed Holan, MD  naproxen (NAPROSYN) 375 MG tablet Take 1 tablet (375 mg total) by mouth 2 (two) times daily. 01/22/19   Abdulrahim Siddiqi, MD    Family History Family History  Problem Relation Age of Onset  . Diabetes Other   . Hypertension Other     Social History Social History   Tobacco Use  . Smoking status: Former Smoker    Types: E-cigarettes  . Smokeless tobacco: Never Used  Substance Use Topics  . Alcohol use: Yes    Comment: occ  . Drug use: Not Currently    Types: Marijuana    Comment: occ     Allergies   Lactose intolerance (gi)    Review of Systems Review of Systems  Constitutional: Negative for appetite change and fever.  HENT: Negative for congestion.   Eyes: Negative for visual disturbance.  Respiratory: Negative for cough, choking and shortness of breath.   Cardiovascular: Negative for chest pain.  Gastrointestinal: Positive for constipation, nausea and vomiting. Negative for diarrhea.  Genitourinary: Negative for difficulty urinating.  Musculoskeletal: Negative for arthralgias.  Skin: Negative for rash.  Neurological: Negative for dizziness and speech difficulty.  Psychiatric/Behavioral: Negative for agitation.  All other systems reviewed and are negative.    Physical Exam Updated Vital Signs BP (!) 140/98 (BP Location: Left Arm)   Pulse (!) 113   Temp 98.9 F (37.2 C) (Oral)   Resp 15   Ht 5\' 4"  (1.626 m)   Wt 67.6 kg   LMP 03/06/2019 (Exact Date) Comment: (-) urine preg//ac  SpO2 100%   BMI 25.58 kg/m   Physical Exam Vitals signs and nursing note reviewed.  Constitutional:      General: She is not in acute distress.    Appearance: She is normal weight.  HENT:     Head: Normocephalic and atraumatic.     Nose: Nose normal.     Mouth/Throat:     Mouth: Mucous membranes are moist.  Eyes:  Conjunctiva/sclera: Conjunctivae normal.     Pupils: Pupils are equal, round, and reactive to light.  Neck:     Musculoskeletal: Normal range of motion and neck supple.  Cardiovascular:     Rate and Rhythm: Normal rate and regular rhythm.     Pulses: Normal pulses.     Heart sounds: Normal heart sounds.  Pulmonary:     Effort: Pulmonary effort is normal.     Breath sounds: Normal breath sounds.  Abdominal:     General: Abdomen is flat. Bowel sounds are normal.     Tenderness: There is no abdominal tenderness. There is no guarding or rebound.  Musculoskeletal: Normal range of motion.  Skin:    General: Skin is warm and dry.     Capillary Refill: Capillary refill takes less than 2 seconds.   Neurological:     General: No focal deficit present.     Mental Status: She is alert and oriented to person, place, and time.     Deep Tendon Reflexes: Reflexes normal.  Psychiatric:        Mood and Affect: Mood normal.        Behavior: Behavior normal.      ED Treatments / Results  Labs (all labs ordered are listed, but only abnormal results are displayed) Labs Reviewed  PREGNANCY, URINE  URINALYSIS, ROUTINE W REFLEX MICROSCOPIC    EKG None  Radiology No results found.  Procedures Procedures (including critical care time)  Medications Ordered in ED Medications  ondansetron (ZOFRAN-ODT) disintegrating tablet 8 mg (8 mg Oral Given 03/27/19 0006)  hyoscyamine (LEVSIN SL) SL tablet 0.25 mg (0.25 mg Sublingual Given 03/27/19 0005)  alum & mag hydroxide-simeth (MAALOX/MYLANTA) 200-200-20 MG/5ML suspension 30 mL (30 mLs Oral Given 03/27/19 0007)    And  lidocaine (XYLOCAINE) 2 % viscous mouth solution 15 mL (15 mLs Oral Given 03/27/19 0007)     Well appearing.  No signs of a surgical abdomen.  I do not believe imaging is indicated at this time. States this has happened in the past.  I suspect gastritis from vomiting.  Will treat for same.  PO challenged successfully.  Bland diet instructions given.  Feeling improved post medication.  Stable for discharge with close follow up.  Strict return precautions given.    Emily Yu was evaluated in Emergency Department on 03/27/2019 for the symptoms described in the history of present illness. She was evaluated in the context of the global COVID-19 pandemic, which necessitated consideration that the patient might be at risk for infection with the SARS-CoV-2 virus that causes COVID-19. Institutional protocols and algorithms that pertain to the evaluation of patients at risk for COVID-19 are in a state of rapid change based on information released by regulatory bodies including the CDC and federal and state organizations. These policies and  algorithms were followed during the patient's care in the ED.   Final Clinical Impressions(s) / ED Diagnoses   Return for intractable cough, coughing up blood,fevers >100.4 unrelieved by medication, shortness of breath, intractable vomiting, chest pain, shortness of breath, weakness,numbness, changes in speech, facial asymmetry,abdominal pain, passing out,Inability to tolerate liquids or food, cough, altered mental status or any concerns. No signs of systemic illness or infection. The patient is nontoxic-appearing on exam and vital signs are within normal limits.   I have reviewed the triage vital signs and the nursing notes. Pertinent labs &imaging results that were available during my care of the patient were reviewed by me and considered in my  medical decision making (see chart for details).After history, exam, and medical workup I feel the patient has beenappropriately medically screened and is safe for discharge home. Pertinent diagnoses were discussed with the patient. Patient was given return precautions.    Srija Southard, MD 03/27/19 [id number]

## 2019-03-27 NOTE — ED Notes (Signed)
ED Provider at bedside. 

## 2019-03-27 NOTE — ED Notes (Signed)
Patient transported to X-ray 

## 2019-03-27 NOTE — ED Notes (Signed)
Pt states she is doing okay, offered ginger ale but pt states she does not want any  Pt has ice chips and water at bedside

## 2020-01-10 ENCOUNTER — Other Ambulatory Visit: Payer: Self-pay

## 2020-01-10 ENCOUNTER — Emergency Department (HOSPITAL_BASED_OUTPATIENT_CLINIC_OR_DEPARTMENT_OTHER)
Admission: EM | Admit: 2020-01-10 | Discharge: 2020-01-10 | Disposition: A | Discharge status: Home / Self Care | Payer: Medicaid Other | Attending: Emergency Medicine | Admitting: Emergency Medicine

## 2020-01-10 ENCOUNTER — Encounter (HOSPITAL_BASED_OUTPATIENT_CLINIC_OR_DEPARTMENT_OTHER): Payer: Self-pay | Admitting: Emergency Medicine

## 2020-01-10 DIAGNOSIS — Z87891 Personal history of nicotine dependence: Secondary | ICD-10-CM | POA: Insufficient documentation

## 2020-01-10 DIAGNOSIS — N938 Other specified abnormal uterine and vaginal bleeding: Secondary | ICD-10-CM | POA: Insufficient documentation

## 2020-01-10 LAB — WET PREP, GENITAL
Clue Cells Wet Prep HPF POC: NONE SEEN
Sperm: NONE SEEN
Trich, Wet Prep: NONE SEEN
Yeast Wet Prep HPF POC: NONE SEEN

## 2020-01-10 LAB — URINALYSIS, ROUTINE W REFLEX MICROSCOPIC
Bilirubin Urine: NEGATIVE
Glucose, UA: >=500 mg/dL — AB
Ketones, ur: 40 mg/dL — AB
Nitrite: POSITIVE — AB
Protein, ur: >300 mg/dL — AB
Specific Gravity, Urine: 1.015 (ref 1.005–1.030)
pH: 8.5 — ABNORMAL HIGH (ref 5.0–8.0)

## 2020-01-10 LAB — URINALYSIS, MICROSCOPIC (REFLEX): RBC / HPF: >50 RBC/hpf (ref 0–5)

## 2020-01-10 LAB — PREGNANCY, URINE: Preg Test, Ur: NEGATIVE

## 2020-01-10 MED ORDER — KETOROLAC TROMETHAMINE 60 MG/2ML IM SOLN
15.0000 mg | Freq: Once | INTRAMUSCULAR | Status: AC
  Administered 2020-01-10: 15 mg via INTRAMUSCULAR
  Filled 2020-01-10: qty 2

## 2020-01-10 MED ORDER — ACETAMINOPHEN 500 MG PO TABS
1000.0000 mg | ORAL_TABLET | Freq: Once | ORAL | Status: AC
  Administered 2020-01-10: 1000 mg via ORAL
  Filled 2020-01-10: qty 2

## 2020-01-10 NOTE — ED Triage Notes (Signed)
Patient presents with complaints of lower abd pain and vaginal bleeding; states bleeding onset 5 days ago; states bleeding increased 3 days ago.

## 2020-01-10 NOTE — ED Provider Notes (Signed)
[facility] EMERGENCY DEPARTMENT Provider Note   CSN: [id number] Arrival date & time: 01/10/20  0122     History Chief Complaint  Patient presents with  . Vaginal Bleeding    Emily Yu is a 24 y.o. female.  24 yo F with a chief complaint of vaginal bleeding. Started with normal menstrual cycle and is just lasting longer than typical. Having more painful cramping and having heavier cycle. Denies likelihood of being pregnant. Denies urinary symptoms. No fevers.  The history is provided by the patient.  Vaginal Bleeding Quality:  Typical of menses Severity:  Severe Onset quality:  Gradual Duration:  5 days Timing:  Constant Progression:  Worsening Chronicity:  New Menstrual history:  Regular Possible pregnancy: no   Relieved by:  Nothing Worsened by:  Nothing Ineffective treatments:  None tried Associated symptoms: no dizziness, no dysuria, no fever and no nausea        Past Medical History:  Diagnosis Date  . Anemia     There are no problems to display for this patient.   Past Surgical History:  Procedure Laterality Date  . WRIST SURGERY       OB History   No obstetric history on file.     Family History  Problem Relation Age of Onset  . Diabetes Other   . Hypertension Other     Social History   Tobacco Use  . Smoking status: Former Smoker    Types: E-cigarettes  . Smokeless tobacco: Never Used  Vaping Use  . Vaping Use: Every day  . Substances: Nicotine, CBD  Substance Use Topics  . Alcohol use: Yes    Comment: occ  . Drug use: Not Currently    Types: Marijuana    Comment: occ    Home Medications Prior to Admission medications   Medication Sig Start Date End Date Taking? Authorizing Provider  dicyclomine (BENTYL) 20 MG tablet Take 1 tablet (20 mg total) by mouth 2 (two) times daily. 03/27/19   Palumbo, April, MD  doxycycline (VIBRAMYCIN) 100 MG capsule Take 1 capsule (100 mg total) by mouth 2 (two) times daily. One po bid x  7 days 01/22/19   Palumbo, April, MD  famotidine (PEPCID) 20 MG tablet Take 1 tablet (20 mg total) by mouth 2 (two) times daily. 03/27/19   Palumbo, April, MD  naproxen (NAPROSYN) 375 MG tablet Take 1 tablet (375 mg total) by mouth 2 (two) times daily. 01/22/19   Palumbo, April, MD  ondansetron (ZOFRAN ODT) 8 MG disintegrating tablet 8mg  ODT q8 hours prn nausea 03/27/19   Palumbo, April, MD  polyethylene glycol (MIRALAX / GLYCOLAX) 17 g packet Take 17 g by mouth daily. 03/27/19   Palumbo, April, MD    Allergies    Lactose intolerance (gi)  Review of Systems   Review of Systems  Constitutional: Negative for chills and fever.  HENT: Negative for congestion and rhinorrhea.   Eyes: Negative for redness and visual disturbance.  Respiratory: Negative for shortness of breath and wheezing.   Cardiovascular: Negative for chest pain and palpitations.  Gastrointestinal: Negative for nausea and vomiting.  Genitourinary: Positive for pelvic pain and vaginal bleeding. Negative for dysuria and urgency.  Musculoskeletal: Negative for arthralgias and myalgias.  Skin: Negative for pallor and wound.  Neurological: Negative for dizziness and headaches.    Physical Exam Updated Vital Signs BP (!) 146/89 (BP Location: Right Arm)   Pulse 96   Temp 98.3 F (36.8 C) (Oral)   Resp 18  Ht 5\' 4"  (1.626 m)   Wt 78.5 kg   LMP 01/05/2020   SpO2 99%   BMI 29.71 kg/m   Physical Exam Vitals and nursing note reviewed.  Constitutional:      General: She is not in acute distress.    Appearance: She is well-developed. She is not diaphoretic.  HENT:     Head: Normocephalic and atraumatic.  Eyes:     Pupils: Pupils are equal, round, and reactive to light.  Cardiovascular:     Rate and Rhythm: Normal rate and regular rhythm.     Heart sounds: No murmur heard.  No friction rub. No gallop.   Pulmonary:     Effort: Pulmonary effort is normal.     Breath sounds: No wheezing or rales.  Abdominal:     General:  There is no distension.     Palpations: Abdomen is soft.     Tenderness: There is abdominal tenderness.     Comments: Suprapubic pain  Genitourinary:    Cervix: Cervical bleeding present. No cervical motion tenderness.     Uterus: Tender.      Adnexa: Right adnexa normal and left adnexa normal.     Comments: Mild diffuse uterine tenderness, trace blood in the vault Musculoskeletal:        General: No tenderness.     Cervical back: Normal range of motion and neck supple.  Skin:    General: Skin is warm and dry.  Neurological:     Mental Status: She is alert and oriented to person, place, and time.  Psychiatric:        Behavior: Behavior normal.     ED Results / Procedures / Treatments   Labs (all labs ordered are listed, but only abnormal results are displayed) Labs Reviewed  WET PREP, GENITAL - Abnormal; Notable for the following components:      Result Value   WBC, Wet Prep HPF POC MODERATE (*)    All other components within normal limits  URINALYSIS, ROUTINE W REFLEX MICROSCOPIC - Abnormal; Notable for the following components:   Color, Urine RED (*)    APPearance CLOUDY (*)    pH 8.5 (*)    Glucose, UA >=500 (*)    Hgb urine dipstick LARGE (*)    Ketones, ur 40 (*)    Protein, ur >300 (*)    Nitrite POSITIVE (*)    Leukocytes,Ua MODERATE (*)    All other components within normal limits  URINALYSIS, MICROSCOPIC (REFLEX) - Abnormal; Notable for the following components:   Bacteria, UA MANY (*)    All other components within normal limits  PREGNANCY, URINE  GC/CHLAMYDIA PROBE AMP ([place]) NOT AT [facility]    EKG None  Radiology No results found.  Procedures Procedures (including critical care time)  Medications Ordered in ED Medications  ketorolac (TORADOL) injection 15 mg (15 mg Intramuscular Given 01/10/20 0245)  acetaminophen (TYLENOL) tablet 1,000 mg (1,000 mg Oral Given 01/10/20 0245)    ED Course  I have reviewed the triage vital signs and the  nursing notes.  Pertinent labs & imaging results that were available during my care of the patient were reviewed by me and considered in my medical decision making (see chart for details).    MDM Rules/Calculators/A&P                          24 yo F with a cc of pelvic cramping.  Going on for the  past 5 days. Started as her menses typically should but is lasting longer and the bleeding is more heavy than normal. Obtain pregnancy test. Pelvic exam.  Pregnancy test is negative.  Exam without exsanguination.  We will have the patient follow-up with OB/GYN in the office.  3:31 AM:  I have discussed the diagnosis/risks/treatment options with the patient and believe the pt to be eligible for discharge home to follow-up with GYN. We also discussed returning to the ED immediately if new or worsening sx occur. We discussed the sx which are most concerning (e.g., sudden worsening pain, fever, inability to tolerate by mouth) that necessitate immediate return. Medications administered to the patient during their visit and any new prescriptions provided to the patient are listed below.  Medications given during this visit Medications  ketorolac (TORADOL) injection 15 mg (15 mg Intramuscular Given 01/10/20 0245)  acetaminophen (TYLENOL) tablet 1,000 mg (1,000 mg Oral Given 01/10/20 0245)     The patient appears reasonably screen and/or stabilized for discharge and I doubt any other medical condition or other [facility] requiring further screening, evaluation, or treatment in the ED at this time prior to discharge.   Final Clinical Impression(s) / ED Diagnoses Final diagnoses:  Dysfunctional uterine bleeding    Rx / DC Orders ED Discharge Orders    None       Melene Plan, DO 01/10/20 [id number]

## 2020-01-10 NOTE — Discharge Instructions (Signed)
Take 4 over the counter ibuprofen tablets 3 times a day or 2 over-the-counter naproxen tablets twice a day for pain. Also take tylenol 1000mg (2 extra strength) four times a day.   Return for worsening bleeding or if you feel like you might pass out.

## 2020-01-12 LAB — GC/CHLAMYDIA PROBE AMP (~~LOC~~) NOT AT ARMC
Chlamydia: NEGATIVE
Comment: NEGATIVE
Comment: NORMAL
Neisseria Gonorrhea: NEGATIVE

## 2020-04-05 ENCOUNTER — Other Ambulatory Visit: Payer: Self-pay

## 2020-04-05 ENCOUNTER — Inpatient Hospital Stay (HOSPITAL_COMMUNITY)
Admission: AD | Admit: 2020-04-05 | Discharge: 2020-04-05 | Disposition: A | Discharge status: Home / Self Care | Payer: Medicaid Other | Attending: Family Medicine | Admitting: Family Medicine

## 2020-04-05 DIAGNOSIS — R252 Cramp and spasm: Secondary | ICD-10-CM | POA: Insufficient documentation

## 2020-04-05 DIAGNOSIS — N946 Dysmenorrhea, unspecified: Secondary | ICD-10-CM

## 2020-04-05 DIAGNOSIS — R58 Hemorrhage, not elsewhere classified: Secondary | ICD-10-CM | POA: Diagnosis not present

## 2020-04-05 DIAGNOSIS — Z3202 Encounter for pregnancy test, result negative: Secondary | ICD-10-CM

## 2020-04-05 LAB — POCT PREGNANCY, URINE: Preg Test, Ur: NEGATIVE

## 2020-04-05 LAB — HCG, QUANTITATIVE, PREGNANCY: hCG, Beta Chain, Quant, S: 1 m[IU]/mL (ref <5)

## 2020-04-05 NOTE — Discharge Instructions (Signed)
Dysmenorrhea Dysmenorrhea means painful cramps during your period (menstrual period). You will have pain in your lower belly (abdomen). The pain is caused by the tightening (contracting) of the muscles of the womb (uterus). The pain may be mild or very bad. With this condition, you may:  Have a headache.  Feel sick to your stomach (nauseous).  Throw up (vomit).  Have lower back pain. Follow these instructions at home: Helping pain and cramping   Put heat on your lower back or belly when you have pain or cramps. Use the heat source that your doctor tells you to use. ? Place a towel between your skin and the heat. ? Leave the heat on for 20-30 minutes. ? Remove the heat if your skin turns bright red. This is especially important if you cannot feel pain, heat, or cold. ? Do not have a heating pad on during sleep.  Do aerobic exercises. These include walking, swimming, or biking. These may help with cramps.  Massage your lower back or belly. This may help lessen pain. General instructions  Take over-the-counter and prescription medicines only as told by your doctor.  Do not drive or use heavy machinery while taking prescription pain medicine.  Avoid alcohol and caffeine during and right before your period. These can make cramps worse.  Do not use any products that have nicotine or tobacco. These include cigarettes and e-cigarettes. If you need help quitting, ask your doctor.  Keep all follow-up visits as told by your doctor. This is important. Contact a doctor if:  You have pain that gets worse.  You have pain that does not get better with medicine.  You have pain during sex.  You feel sick to your stomach or you throw up during your period, and medicine does not help. Get help right away if:  You pass out (faint). Summary  Dysmenorrhea means painful cramps during your period (menstrual period).  Put heat on your lower back or belly when you have pain or cramps.  Do  exercises like walking, swimming, or biking to help with cramps.  Contact a doctor if you have pain during sex. This information is not intended to replace advice given to you by your health care provider. Make sure you discuss any questions you have with your health care provider. Document Revised: 06/01/2017 Document Reviewed: 07/06/2016 Elsevier Patient Education  2020 Elsevier Inc.  

## 2020-04-05 NOTE — MAU Provider Note (Signed)
First Provider Initiated Contact with Patient 04/05/20 1604     S Ms. Emily Yu is a 24 y.o. non-pregnant female who presents to MAU today with complaint of vaginal bleeding and cramping. She had a SAB 03/05/20 with quants trended to 0. She said she got a positive pregnancy test two weeks ago and has been experiencing cramping and bleeding x2 days.  O BP (!) 141/70 (BP Location: Right Arm)   Pulse 73   Temp 98.3 F (36.8 C) (Oral)   Resp 17   Ht 5\' 5"  (1.651 m)   Wt 178 lb (80.7 kg)   LMP 04/03/2020   SpO2 100%   BMI 29.62 kg/m  Constitutional: Well-developed, well-nourished female in mild distress.  Cardiovascular: normal rate & rhythm Respiratory: normal rate and effort. GI: Abd soft, non-tender MS: Extremities nontender, no edema, normal ROM Neurologic: Alert and oriented x 4.   A Negative pregnancy test Painful menses Medical screening exam complete  P Educated on regular menses, possible luteal cysts and common side effects of normal hormonal swings throughout the menstrual cycle Discharge from MAU in stable condition with bleeding precautions Encouraged to establish routine gyn care. Pt has Medicaid, encouraged to begin care at [facility].  Patient may return to MAU as needed for emergent OB/GYN related complaints  [facility], CNM 04/05/2020 4:16 PM

## 2020-04-05 NOTE — MAU Note (Addendum)
Bleeding and cramping, started yesterday.  "Had a  miscarriage 9/3, hormone level was already completely gone, was just passing parts of the baby".  Reports +preg test 2 wks ago.  States this happens in her family.  Pregnancies go undetected in urine and blood.  Missed twins and triplets.  Family members have had miscarriage, but still preg with other twin.

## 2020-05-07 ENCOUNTER — Encounter: Payer: Medicaid Other | Admitting: Obstetrics and Gynecology

## 2020-05-07 ENCOUNTER — Encounter: Payer: Self-pay | Admitting: Obstetrics and Gynecology

## 2020-05-07 NOTE — Progress Notes (Signed)
Patient did not keep her GYN ED referral appointment for 05/07/2020.  Cornelia Copa MD Attending Center for [employer] [facility])

## 2020-06-28 ENCOUNTER — Other Ambulatory Visit: Payer: Self-pay

## 2020-06-28 ENCOUNTER — Ambulatory Visit (INDEPENDENT_AMBULATORY_CARE_PROVIDER_SITE_OTHER): Payer: Medicaid Other | Admitting: Obstetrics & Gynecology

## 2020-06-28 ENCOUNTER — Other Ambulatory Visit (HOSPITAL_COMMUNITY)
Admission: RE | Admit: 2020-06-28 | Discharge: 2020-06-28 | Disposition: A | Discharge status: Home / Self Care | Payer: Medicaid Other | Source: Ambulatory Visit | Attending: Obstetrics & Gynecology | Admitting: Obstetrics & Gynecology

## 2020-06-28 ENCOUNTER — Encounter: Payer: Self-pay | Admitting: Obstetrics & Gynecology

## 2020-06-28 VITALS — BP 138/94 | HR 91 | Ht 64.0 in | Wt 186.0 lb

## 2020-06-28 DIAGNOSIS — Z124 Encounter for screening for malignant neoplasm of cervix: Secondary | ICD-10-CM | POA: Diagnosis not present

## 2020-06-28 DIAGNOSIS — Z113 Encounter for screening for infections with a predominantly sexual mode of transmission: Secondary | ICD-10-CM | POA: Diagnosis not present

## 2020-06-28 DIAGNOSIS — K5900 Constipation, unspecified: Secondary | ICD-10-CM

## 2020-06-28 DIAGNOSIS — R3989 Other symptoms and signs involving the genitourinary system: Secondary | ICD-10-CM

## 2020-06-28 DIAGNOSIS — R102 Pelvic and perineal pain: Secondary | ICD-10-CM | POA: Diagnosis not present

## 2020-06-28 DIAGNOSIS — R232 Flushing: Secondary | ICD-10-CM

## 2020-06-28 LAB — POCT URINALYSIS DIP (DEVICE)
Bilirubin Urine: NEGATIVE
Glucose, UA: NEGATIVE mg/dL
Hgb urine dipstick: NEGATIVE
Ketones, ur: NEGATIVE mg/dL
Leukocytes,Ua: NEGATIVE
Nitrite: NEGATIVE
Protein, ur: NEGATIVE mg/dL
Specific Gravity, Urine: >=1.03 (ref 1.005–1.030)
Urobilinogen, UA: 0.2 mg/dL (ref 0.0–1.0)
pH: 7 (ref 5.0–8.0)

## 2020-06-28 NOTE — Progress Notes (Signed)
GYNECOLOGY  VISIT  CC:   Pelvic pain  HPI: 24 y.o. No obstetric history on file. Single Black or [place] American female here for complaint of pelvic pain that seems to have started about 4 months ago.  Pt reports having a positive pregnancy test at home in August.  Had miscarriage in late August.  Was seen in ER, [facility], in early September for this bleeding.  HCG was negative.  Since that time, she's had pelvic pressure and the pelvic pain that is low but shoots through to her back.  She has pain with bowel movements. This is new as well.  Pain feels like it shoots through/down.  Maybe has seen a little blood with bowel movements.  Pt does have constipation and typically goes 4-5 days between bowel movements.  Most recently had one yesterday and it was only two days between bowel movements but this was atypical for her.  Does not have pain with urinary but has a symptom of relief with urination.  Does have pain with intercourse most of the time.  This was present prior to miscarriage but this has worsened.    She's had associated nausea, hot flashes.  This is accompanied by some SOB with the hot flashes.  This is not exertional.  She does have some occasional palpitations but this is when she is feeling more anxious.    Pt does desire pregnancy.  Is accompanied by significant other.  She would prefer not to be on contraception right now if possible.  GYNECOLOGIC HISTORY: Patient's last menstrual period was 06/21/2020. Contraception: none  There are no problems to display for this patient.   Past Medical History:  Diagnosis Date  . Anemia     Past Surgical History:  Procedure Laterality Date  . WRIST SURGERY      MEDS:   No current outpatient medications on file prior to visit.   No current facility-administered medications on file prior to visit.    ALLERGIES: Lactose intolerance (gi)  Family History  Problem Relation Age of Onset  . Diabetes Other   . Hypertension Other      SH:  Single, non smoker  Review of Systems  Constitutional: Negative.   Gastrointestinal: Positive for constipation.  Genitourinary: Positive for difficulty urinating (has pressure prior to urination) and pelvic pain. Negative for frequency.    PHYSICAL EXAMINATION:    BP (!) 138/94   Pulse 91   Ht 5\' 4"  (1.626 m)   Wt 186 lb (84.4 kg)   LMP 06/21/2020   Breastfeeding No   BMI 31.93 kg/m     General appearance: alert, cooperative and appears stated age CV:  Regular rate and rhythm Lungs:  clear to auscultation, no wheezes, rales or rhonchi, symmetric air entry Abdomen: soft, diffuse tenderness above bladder and in right and left loewr quadrants; bowel sounds normal; no masses,  no organomegaly Lymph:  no inguinal LAD noted  Pelvic: External genitalia:  no lesions              Urethra:  normal appearing urethra with no masses, tenderness or lesions              Bartholins and Skenes: normal                 Vagina: normal appearing vagina with normal color and discharge, no lesions              Cervix: no lesions  Bimanual Exam:  Uterus:  normal size, contour, position, consistency, mobility, non-tender              Adnexa: no mass, fullness, tenderness              Anus:  no lesions  Chaperone, Marylynn Pearson, RN, was present for exam.  Assessment/Plan: 1. Pelvic pain - We had a lengthy discussion about the causes of pelvic pain including irritable bowel syndrome, endometriosis, I.C., ovarian cysts, pelvic floor dysfunction.  Given all of this started after recent miscarriage, feel ultrasound is reasonable.  Pt also aware I feel irritatable bowel with constipation is likely contributing as well.  With urinary symptoms, could possibly have I.C.  She understands this is a stepwise process for evaluation.  If all of the above is negative with evaluation, then we would discuss laparoscopy with cystoscopy.   - US PELVIC COMPLETE WITH TRANSVAGINAL; Future  2.  Cervical cancer screening - Cytology - PAP( [place])  3. Screening for STD (sexually transmitted disease) - GC/Chl, trich obtained.  Declines blood work today.  4. Constipation, unspecified constipation type - Ambulatory referral to Gastroenterology  5. Hot flashes - CBC - TSH - Comprehensive metabolic panel  6. Sensation of pressure in bladder area - POCT urinalysis dip (device)--Negative today.    35 minutes of total time was spent for this patient encounter, including preparation, face-to-face counseling with the patient and coordination of care, and documentation of the encounter.

## 2020-06-29 LAB — CYTOLOGY - PAP
Chlamydia: NEGATIVE
Comment: NEGATIVE
Comment: NEGATIVE
Comment: NORMAL
Diagnosis: NEGATIVE
Neisseria Gonorrhea: NEGATIVE
Trichomonas: NEGATIVE

## 2020-06-29 LAB — COMPREHENSIVE METABOLIC PANEL
ALT: 17 IU/L (ref 0–32)
AST: 31 IU/L (ref 0–40)
Albumin/Globulin Ratio: 1.7 (ref 1.2–2.2)
Albumin: 4.6 g/dL (ref 3.9–5.0)
Alkaline Phosphatase: 80 IU/L (ref 44–121)
BUN/Creatinine Ratio: 10 (ref 9–23)
BUN: 9 mg/dL (ref 6–20)
Bilirubin Total: 0.3 mg/dL (ref 0.0–1.2)
CO2: 23 mmol/L (ref 20–29)
Calcium: 9.6 mg/dL (ref 8.7–10.2)
Chloride: 101 mmol/L (ref 96–106)
Creatinine, Ser: 0.87 mg/dL (ref 0.57–1.00)
GFR calc Af Amer: 108 mL/min/{1.73_m2} (ref >59)
GFR calc non Af Amer: 94 mL/min/{1.73_m2} (ref >59)
Globulin, Total: 2.7 g/dL (ref 1.5–4.5)
Glucose: 83 mg/dL (ref 65–99)
Potassium: 4.2 mmol/L (ref 3.5–5.2)
Sodium: 138 mmol/L (ref 134–144)
Total Protein: 7.3 g/dL (ref 6.0–8.5)

## 2020-06-29 LAB — CBC
Hematocrit: 40.6 % (ref 34.0–46.6)
Hemoglobin: 13 g/dL (ref 11.1–15.9)
MCH: 27.2 pg (ref 26.6–33.0)
MCHC: 32 g/dL (ref 31.5–35.7)
MCV: 85 fL (ref 79–97)
Platelets: 356 10*3/uL (ref 150–450)
RBC: 4.78 x10E6/uL (ref 3.77–5.28)
RDW: 16.5 % — ABNORMAL HIGH (ref 11.7–15.4)
WBC: 6.1 10*3/uL (ref 3.4–10.8)

## 2020-06-29 LAB — TSH: TSH: 2.48 u[IU]/mL (ref 0.450–4.500)

## 2020-07-04 DIAGNOSIS — K5900 Constipation, unspecified: Secondary | ICD-10-CM | POA: Insufficient documentation

## 2020-07-04 DIAGNOSIS — R102 Pelvic and perineal pain: Secondary | ICD-10-CM | POA: Insufficient documentation

## 2020-07-05 ENCOUNTER — Other Ambulatory Visit: Payer: Self-pay

## 2020-07-05 ENCOUNTER — Ambulatory Visit
Admission: RE | Admit: 2020-07-05 | Discharge: 2020-07-05 | Disposition: A | Discharge status: Home / Self Care | Payer: Medicaid Other | Source: Ambulatory Visit | Attending: Obstetrics & Gynecology | Admitting: Obstetrics & Gynecology

## 2020-07-05 DIAGNOSIS — R102 Pelvic and perineal pain: Secondary | ICD-10-CM | POA: Insufficient documentation

## 2020-07-06 ENCOUNTER — Telehealth: Payer: Self-pay | Admitting: Lactation Services

## 2020-07-06 NOTE — Telephone Encounter (Signed)
Emily Yu, I did a UPT on her in the office and it was negative.  She has a prolapsed polyp that is coming through the cervix.  This is what I think looks vascular.  She and I discussed this and she will need a hysteroscopy with polyp resection.  I'll send this to [place].  I'll make and addendum on my note so it is addressed.  Thanks.  Rosalita Chessman

## 2020-07-06 NOTE — Telephone Encounter (Signed)
Received call from Erskine Squibb at [facility]. She would like to report that Pelvic US from yesterday show persistent vascular lesion in the endometrial canal. It is recommended that patient have HCG obtained and follow up with Gyn. Will route message to Dr. Hyacinth Meeker for advisement.

## 2020-07-08 ENCOUNTER — Other Ambulatory Visit: Payer: Self-pay | Admitting: Obstetrics & Gynecology

## 2020-07-08 NOTE — Telephone Encounter (Signed)
Called patient with results of [place] and need for HCG and follow up in the office to discuss Hysteroscopy to rule out retained POC or Polyp.   Patient did not answer. LM for her to call the office for results.   Will send My Chart message.   Message to front office to call patient and schedule for in office appt with Dr. Hyacinth Meeker to discuss [place] results.

## 2020-07-08 NOTE — Telephone Encounter (Signed)
Correction about above note.  Pt did have a negative UPT but she did NOT have a prolapsed polyp.  Could you let her know there is a vascular finding on the ultrasound inside the uterus.  This could be some remaining parts of the miscarriage or it could be a polyp.  It needs to be removed.  She needs a quantitative HCG to ensure this is negative and appointment to discuss procedure.  I'm going to go ahead and send a message to our surgery scheduler to get going with scheduling the procedure.  Can you make appt for pt?  Thank you.

## 2020-07-12 NOTE — Telephone Encounter (Signed)
Jasmine December, Just wanted to send this one back and see if you could get the OV scheduled with her.  There's not appointment scheduled yet.  Thank you.  Leda Quail

## 2020-07-22 ENCOUNTER — Encounter: Payer: Self-pay | Admitting: Obstetrics & Gynecology

## 2020-07-22 ENCOUNTER — Other Ambulatory Visit: Payer: Self-pay

## 2020-07-22 ENCOUNTER — Ambulatory Visit (INDEPENDENT_AMBULATORY_CARE_PROVIDER_SITE_OTHER): Payer: Medicaid Other | Admitting: Obstetrics & Gynecology

## 2020-07-22 VITALS — BP 149/84 | HR 90 | Wt 186.6 lb

## 2020-07-22 DIAGNOSIS — K5900 Constipation, unspecified: Secondary | ICD-10-CM | POA: Diagnosis not present

## 2020-07-22 DIAGNOSIS — R102 Pelvic and perineal pain: Secondary | ICD-10-CM | POA: Diagnosis not present

## 2020-07-22 DIAGNOSIS — N9489 Other specified conditions associated with female genital organs and menstrual cycle: Secondary | ICD-10-CM

## 2020-07-22 DIAGNOSIS — N926 Irregular menstruation, unspecified: Secondary | ICD-10-CM | POA: Diagnosis not present

## 2020-07-22 NOTE — Progress Notes (Signed)
GYNECOLOGY  VISIT  CC:   Discuss ultrasound findings and recommendations  HPI: 25 y.o. G1P0010 Single Black or [place] American female here for discussion of ultrasound results and recommendations.  Pt was seen 06/28/2020 due complaint of intermittent pelvic pain/cramping that started after a miscarriage in August.  She's has pelvic pain and pressure since that time.  She does have chronic constipation and this could be contributing but I ordered and ultrasound and there is a 1.4cm endometrial mass with vascularity.  Radiology recommended r/o pregnancy for concerns of retained products.  She did have a negative UPT the day of office visit on 06/28/2020.  Because of the concern for retained products, I went ahead and scheduled her for hysteroscopy with D&C due to time availability for surgery with Covid change in ORs.    Pt is with boyfriend today.  Ultrasound images reviewed.  They clearly see the mass that is present.  I am unsure if this is retained products or a polyp or other finding.  It does need removal.  I cannot guarantee this will fix pain and I do really want her to see GI as well.  Referral was made at 06/28/2020 visit.  She has been called once by GI but no appt is scheduled yet.  All questions answered.  D/w pt options for treatment.  Due to present of mass, I really do think this should be removed.  She and boyfriend are comfortable with this plan.  All questions answered.    Hysteroscopy with resection of lesion, D&C discussed.  Procedure discussed with patient.  Recovery and pain management discussed.  Risks discussed including but not limited to bleeding, rare risk of transfusion, infection, 1% risk of uterine perforation with risks of fluid deficit causing cardiac arrythmia, cerebral swelling and/or need to stop procedure early.  Fluid emboli and rare risk of death discussed.  DVT/PE, rare risk of risk of bowel/bladder/ureteral/vascular injury.  Patient aware if pathology abnormal she may  need additional treatment.  All questions answered.    Patient Active Problem List   Diagnosis Date Noted  . Pelvic pain 07/04/2020  . Constipation 07/04/2020    Past Medical History:  Diagnosis Date  . Anemia     Past Surgical History:  Procedure Laterality Date  . WRIST SURGERY      MEDS:   Current Outpatient Medications on File Prior to Visit  Medication Sig Dispense Refill  . ibuprofen (ADVIL) 800 MG tablet Take 800 mg by mouth every 8 (eight) hours as needed.     No current facility-administered medications on file prior to visit.    ALLERGIES: Lactose intolerance (gi)  Family History  Problem Relation Age of Onset  . Diabetes Other   . Hypertension Other     SH:  Single, non smoker  Review of Systems  Constitutional: Negative.   Respiratory: Negative.   Cardiovascular: Negative.   Gastrointestinal: Positive for constipation.  Genitourinary: Positive for pelvic pain.  Psychiatric/Behavioral: Negative.     PHYSICAL EXAMINATION:    BP (!) 149/84   Pulse 90   Wt 186 lb 9.6 oz (84.6 kg)   Breastfeeding No   BMI 32.03 kg/m     General appearance: alert, cooperative and appears stated age CV:  Regular rate and rhythm Lungs:  clear to auscultation, no wheezes, rales or rhonchi, symmetric air entry Abdomen: soft, non-tender; bowel sounds normal; no masses,  no organomegaly Lymph:  no inguinal LAD noted  Pelvic: Not performed  Chaperone Maxwell Marion, RN,  was present for exam.  Assessment/Plan: 1. Endometrial mass - hysteroscopy with polyp resection, D&C planned  2. Irregular bleeding - Beta hCG quant (ref lab)  3. Pelvic pain  4. Constipation, unspecified constipation type  40 minutes of total time was spent for this patient encounter with at least half o of the time spent discussing ultrasound findings, options for treatment and additional evaluation.  After pt and boyfriend agreed to proceed surgically, remainder of time spent discussing  procedure including risks and benefits as well as pre-op and post-op recommendations.

## 2020-07-23 ENCOUNTER — Encounter (HOSPITAL_BASED_OUTPATIENT_CLINIC_OR_DEPARTMENT_OTHER): Payer: Self-pay | Admitting: Obstetrics & Gynecology

## 2020-07-23 ENCOUNTER — Other Ambulatory Visit: Payer: Self-pay | Admitting: Obstetrics & Gynecology

## 2020-07-23 ENCOUNTER — Other Ambulatory Visit: Payer: Self-pay

## 2020-07-23 LAB — BETA HCG QUANT (REF LAB): hCG Quant: <1 m[IU]/mL

## 2020-07-23 NOTE — Progress Notes (Signed)
Spoke w/ via phone for pre-op interview---pt Lab needs dos----urine poct               Lab results------none COVID test ------07-24-2020 1130 Arrive at -------530 am 07-27-2020 NPO after MN NO Solid Food.  Clear liquids from MN until---430 am then npo Medications to take morning of surgery -----none Diabetic medication -----n/a Patient Special Instructions -----none Pre-Op special Istructions -----none Patient verbalized understanding of instructions that were given at this phone interview. Patient denies shortness of breath, chest pain, fever, cough at this phone interview.

## 2020-07-24 ENCOUNTER — Other Ambulatory Visit (HOSPITAL_COMMUNITY): Payer: Medicaid Other

## 2020-07-26 ENCOUNTER — Other Ambulatory Visit (HOSPITAL_COMMUNITY)
Admission: RE | Admit: 2020-07-26 | Discharge: 2020-07-26 | Disposition: A | Discharge status: Home / Self Care | Payer: Medicaid Other | Source: Ambulatory Visit | Attending: Obstetrics & Gynecology | Admitting: Obstetrics & Gynecology

## 2020-07-26 ENCOUNTER — Encounter (HOSPITAL_COMMUNITY): Payer: Self-pay | Admitting: Anesthesiology

## 2020-07-26 DIAGNOSIS — U071 COVID-19: Secondary | ICD-10-CM | POA: Diagnosis not present

## 2020-07-26 DIAGNOSIS — Z01812 Encounter for preprocedural laboratory examination: Secondary | ICD-10-CM | POA: Insufficient documentation

## 2020-07-26 HISTORY — DX: COVID-19: U07.1

## 2020-07-26 LAB — SARS CORONAVIRUS 2 (TAT 6-24 HRS): SARS Coronavirus 2: POSITIVE — AB

## 2020-07-26 NOTE — Anesthesia Preprocedure Evaluation (Deleted)
Anesthesia Evaluation    Reviewed: Allergy & Precautions, Patient's Chart, lab work & pertinent test results, Unable to perform ROS - Chart review only  Airway        Dental   Pulmonary former smoker,           Cardiovascular negative cardio ROS       Neuro/Psych negative neurological ROS     GI/Hepatic negative GI ROS, Neg liver ROS,   Endo/Other  negative endocrine ROS  Renal/GU negative Renal ROS     Musculoskeletal negative musculoskeletal ROS (+)   Abdominal (+) + obese,   Peds  Hematology  (+) Blood dyscrasia, anemia ,   Anesthesia Other Findings   Reproductive/Obstetrics                             Anesthesia Physical Anesthesia Plan  ASA: II  Anesthesia Plan: General   Post-op Pain Management:    Induction: Intravenous  PONV Risk Score and Plan: 4 or greater and Ondansetron, Dexamethasone, Midazolam, Scopolamine patch - Pre-op and Treatment may vary due to age or medical condition  Airway Management Planned: LMA  Additional Equipment:   Intra-op Plan:   Post-operative Plan: Extubation in OR  Informed Consent:   Plan Discussed with:   Anesthesia Plan Comments:         Anesthesia Quick Evaluation

## 2020-07-27 ENCOUNTER — Ambulatory Visit (HOSPITAL_BASED_OUTPATIENT_CLINIC_OR_DEPARTMENT_OTHER)
Admission: RE | Admit: 2020-07-27 | Payer: Medicaid Other | Source: Ambulatory Visit | Admitting: Obstetrics & Gynecology

## 2020-07-27 HISTORY — DX: Concussion with loss of consciousness status unknown, initial encounter: S06.0XAA

## 2020-07-27 HISTORY — DX: Concussion with loss of consciousness of unspecified duration, initial encounter: S06.0X9A

## 2020-07-27 HISTORY — DX: Other specified conditions associated with female genital organs and menstrual cycle: N94.89

## 2020-07-27 IMAGING — DX DG ABDOMEN ACUTE W/ 1V CHEST
3 series · 3 of 3 positions shown · non-contrast
Comparison: None.

CLINICAL DATA: 23-year-old female with mid abdominal pain.

EXAM:
DG ABDOMEN ACUTE W/ 1V CHEST

[chest pa]
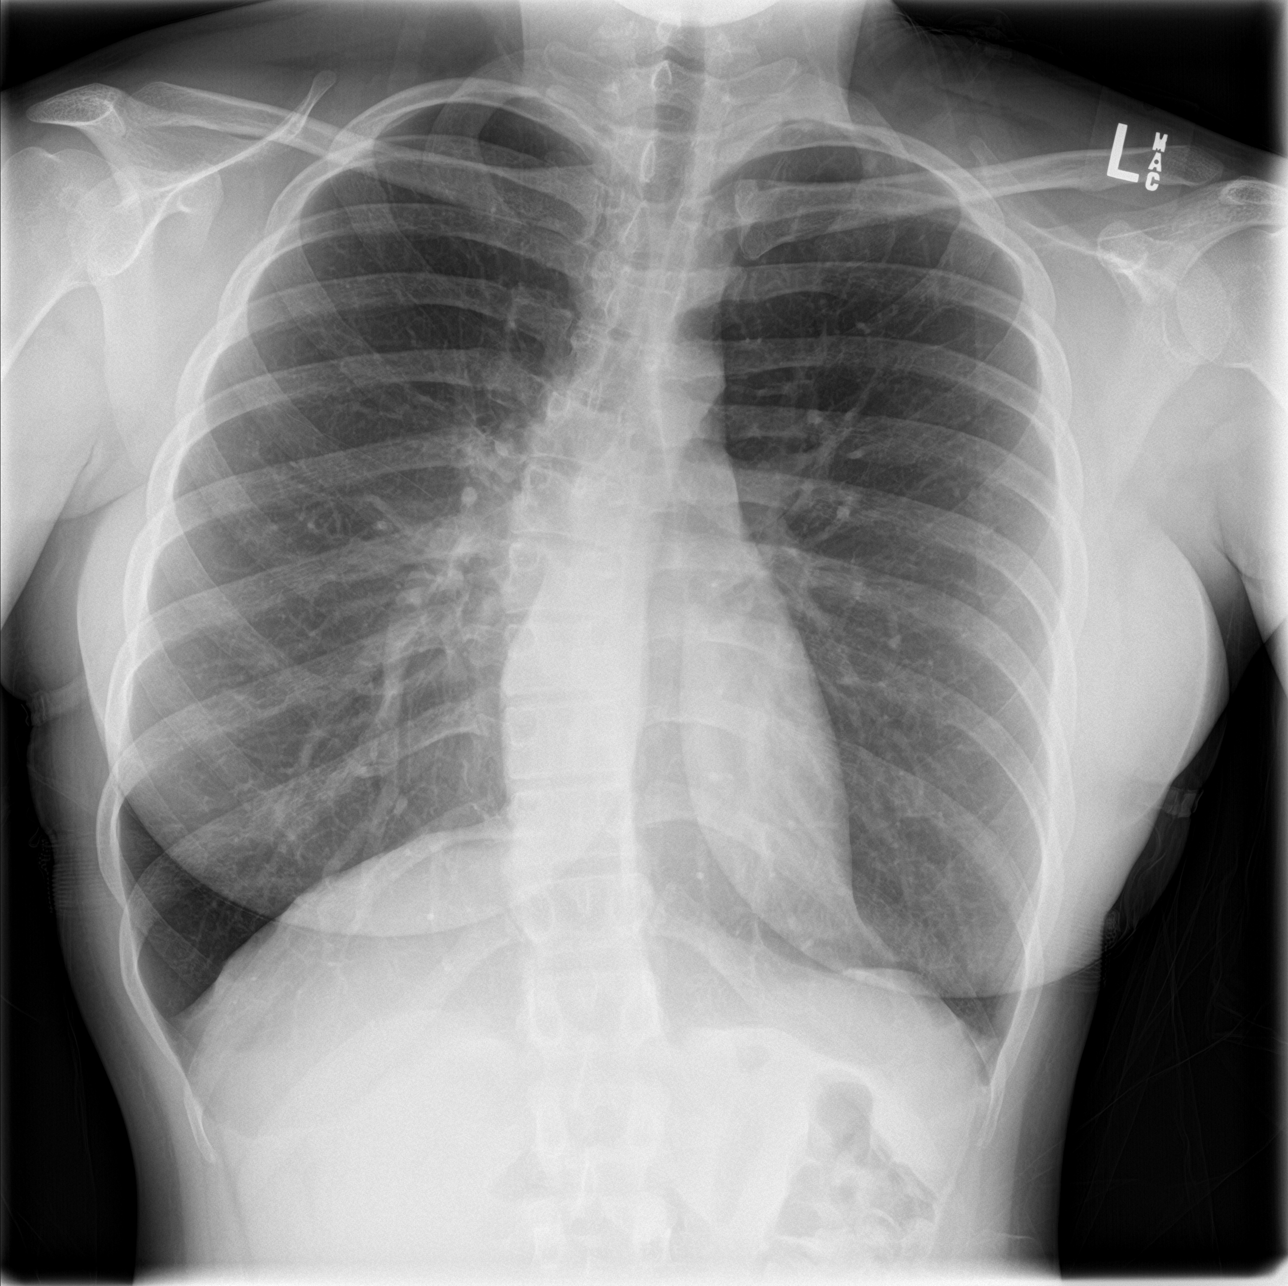

[abdomen erect]
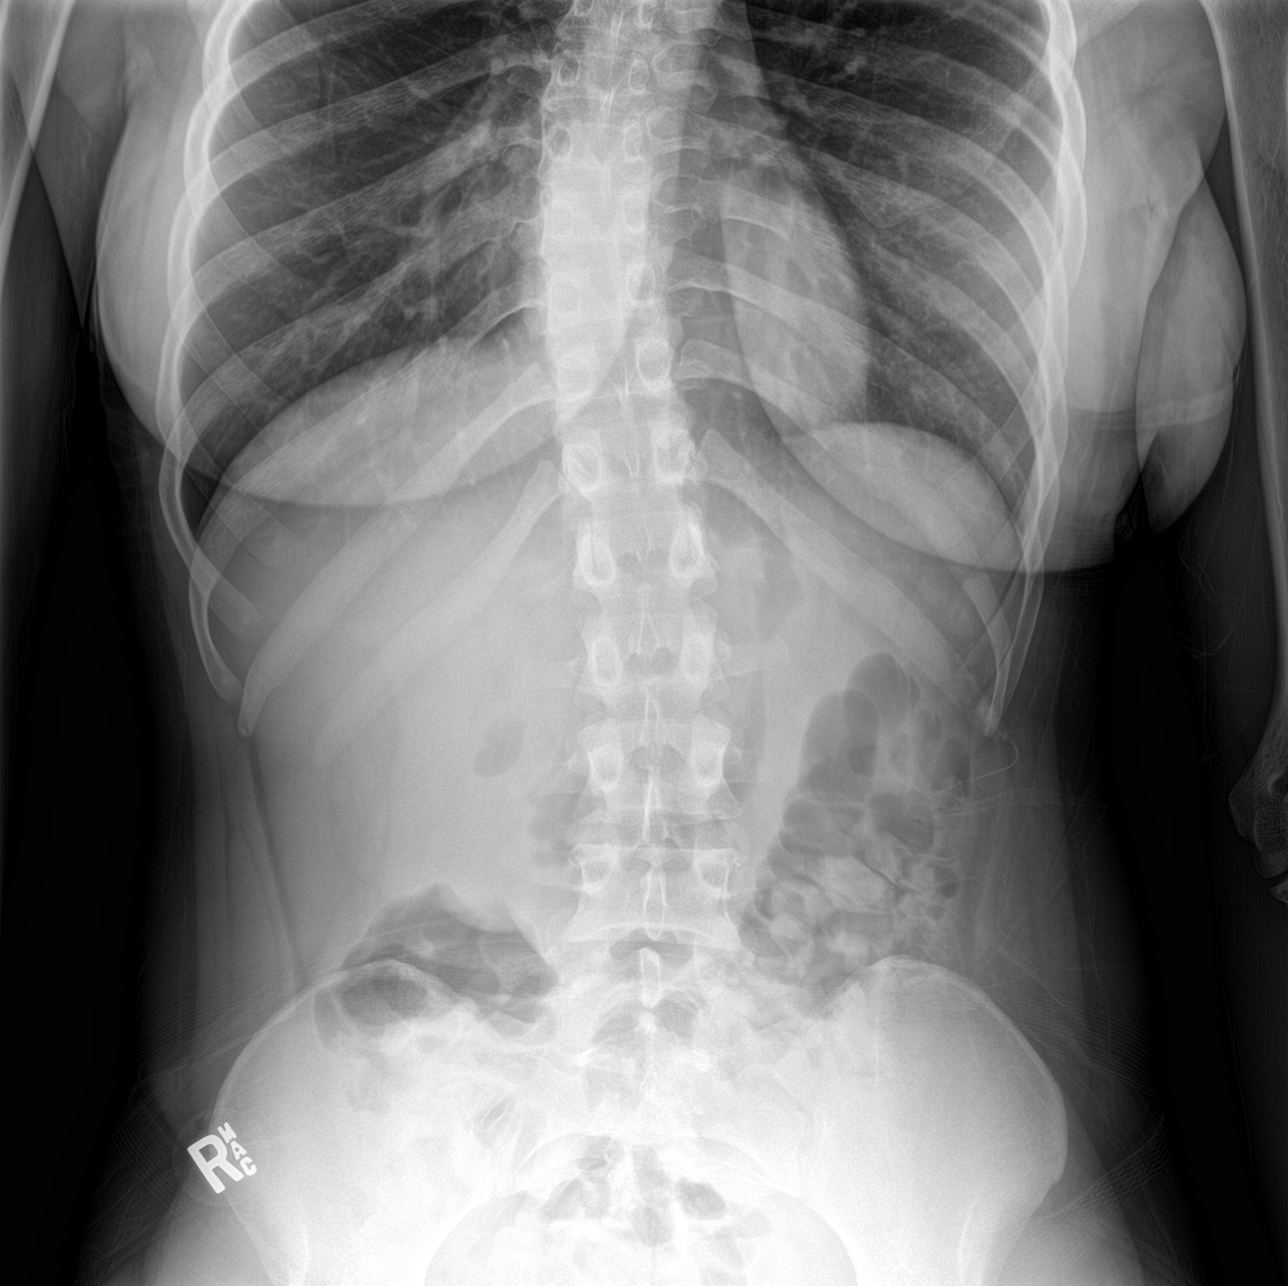

[abdomen kub]
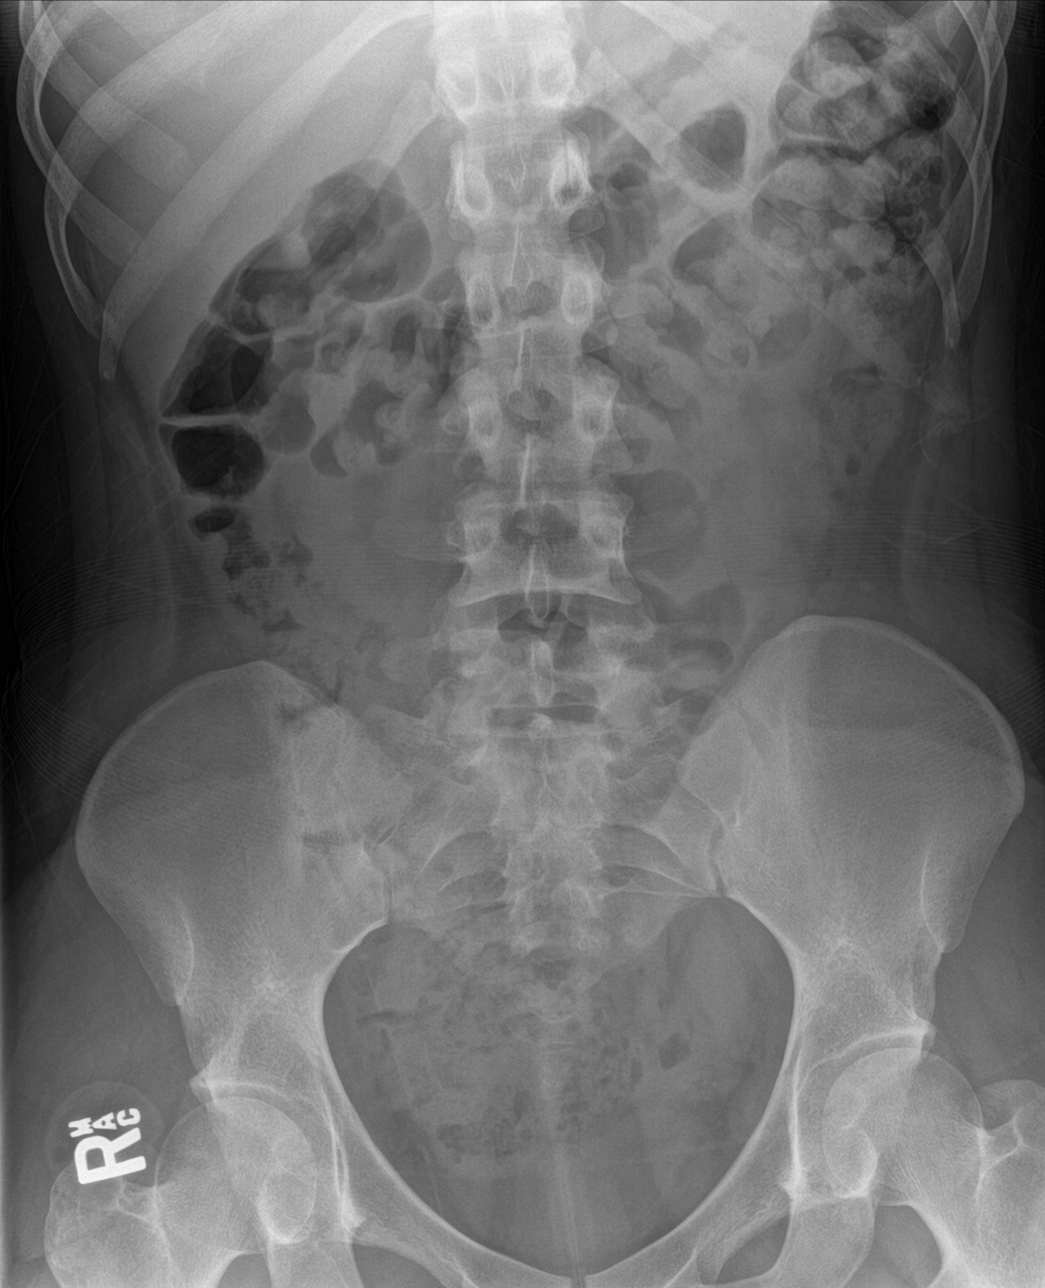

[3 of 3 positions shown; findings below may reference images not displayed]

FINDINGS: There is silhouetting of the right cardiac border which may be
artifactual and related to patient's tilting. Right middle lobe
pneumonia is not excluded. Clinical correlation is recommended. PA
and lateral views of the chest may provide better evaluation if
there is clinical concern for pneumonia. There is no pleural
effusion or pneumothorax. The cardiac silhouette is within normal
limits.

There is no bowel dilatation or evidence of obstruction. No free air
or radiopaque calculi. The osseous structures and soft tissues are
unremarkable.
IMPRESSION: 1. Artifact versus less likely right middle lobe pneumonia. Clinical
correlation is recommended.
2. No bowel obstruction.

## 2020-07-27 IMAGING — DX DG CHEST 2V
2 series · 2 of 2 positions shown · non-contrast
Comparison: Earlier radiograph 03/27/2019

CLINICAL DATA: 23-year-old female with abdominal pain.

EXAM:
CHEST - 2 VIEW

[chest pa]
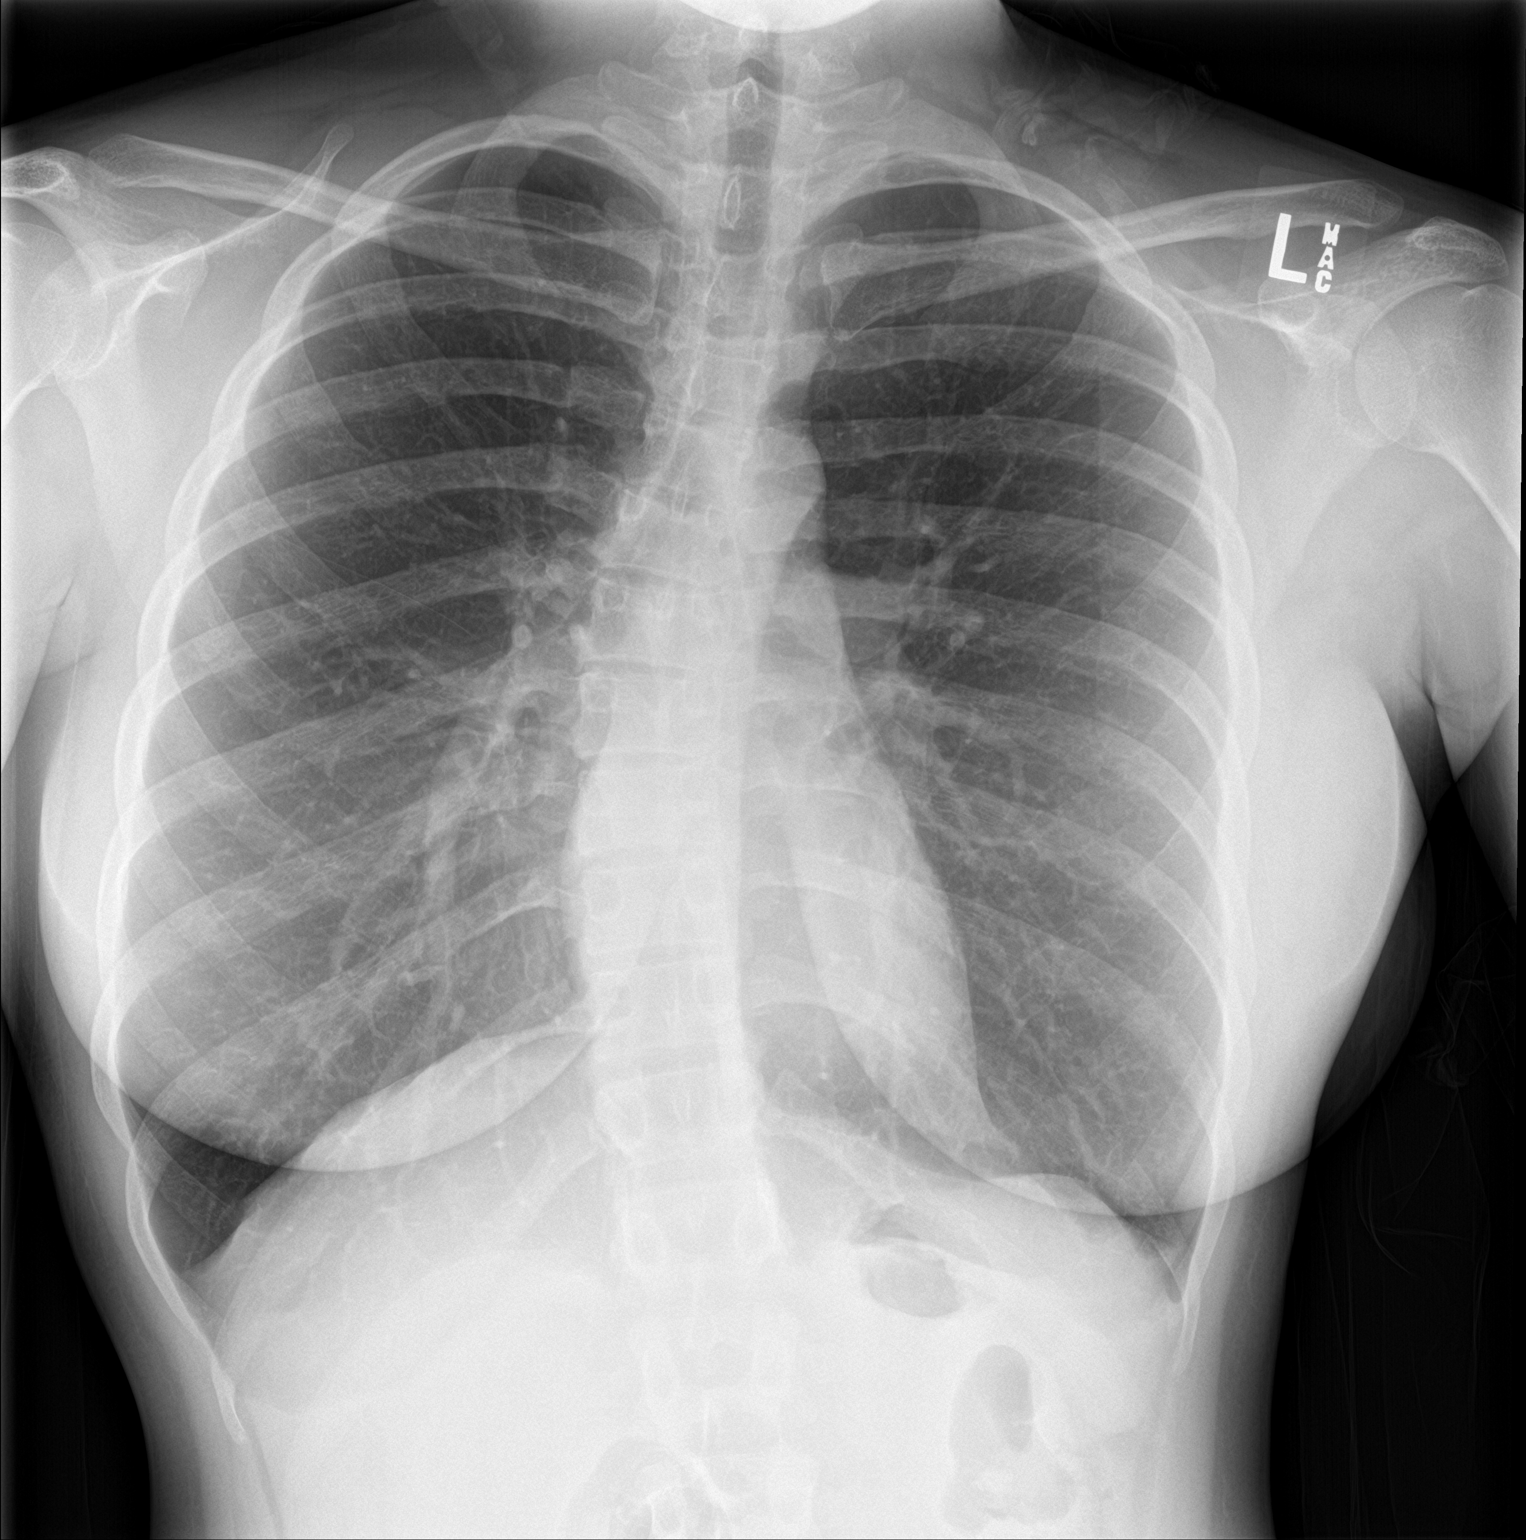

[chest lat]
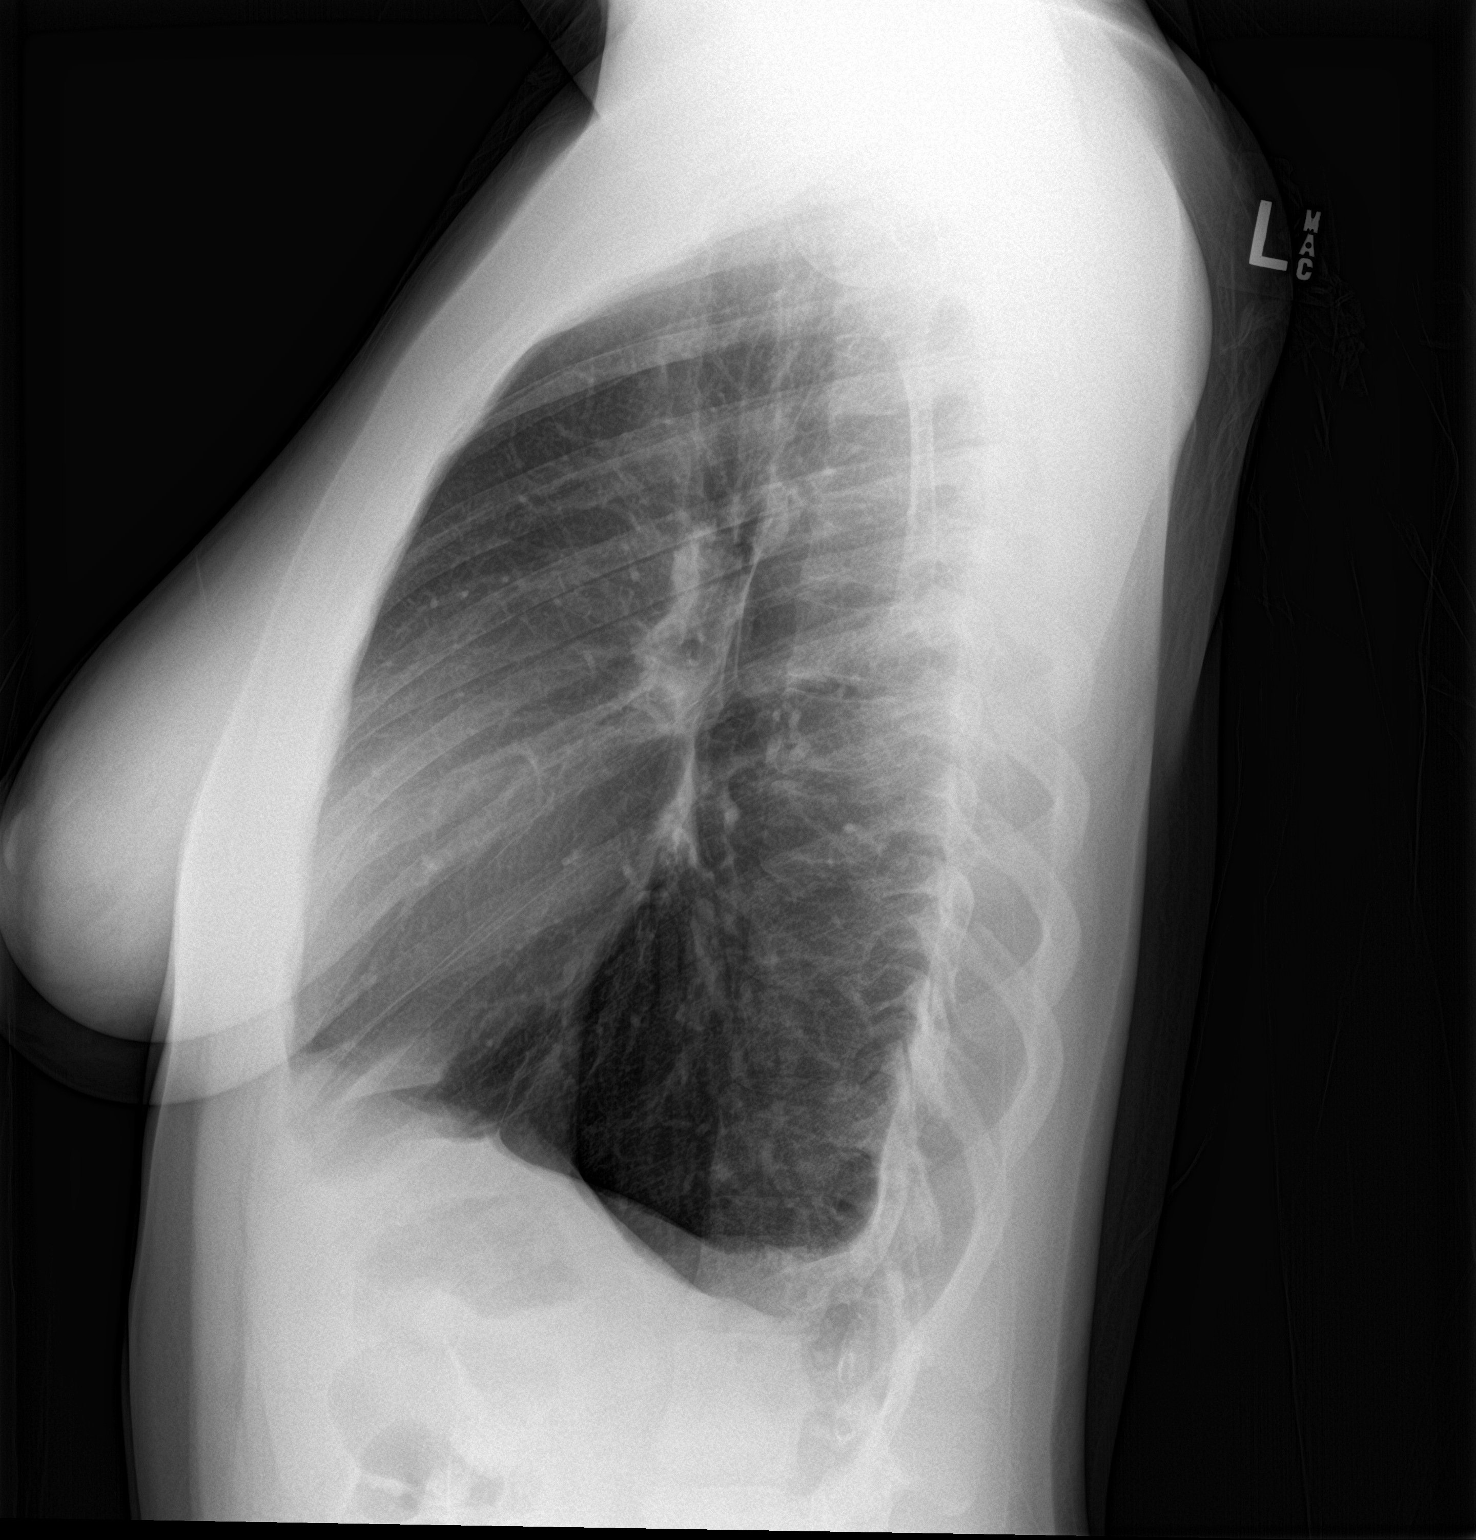

[2 of 2 positions shown; findings below may reference images not displayed]

FINDINGS: The lungs are clear. There is no pleural effusion or pneumothorax.
The cardiac silhouette is within normal limits. Mild thoracic
dextroscoliosis. No acute osseous pathology.
IMPRESSION: No active cardiopulmonary disease.

## 2020-07-27 SURGERY — DILATATION AND CURETTAGE /HYSTEROSCOPY
Anesthesia: Choice

## 2020-07-27 NOTE — Progress Notes (Signed)
Patient with positive covid result. Contacted MD and informed of result.   

## 2020-08-18 ENCOUNTER — Other Ambulatory Visit (HOSPITAL_BASED_OUTPATIENT_CLINIC_OR_DEPARTMENT_OTHER): Payer: Self-pay | Admitting: Obstetrics & Gynecology

## 2020-09-01 ENCOUNTER — Encounter (HOSPITAL_COMMUNITY): Admission: RE | Admit: 2020-09-01 | Payer: Medicaid Other | Source: Ambulatory Visit

## 2020-09-01 ENCOUNTER — Encounter (HOSPITAL_BASED_OUTPATIENT_CLINIC_OR_DEPARTMENT_OTHER): Payer: Self-pay | Admitting: Obstetrics & Gynecology

## 2020-09-02 ENCOUNTER — Inpatient Hospital Stay (HOSPITAL_COMMUNITY): Admission: RE | Admit: 2020-09-02 | Payer: Medicaid Other | Source: Ambulatory Visit

## 2020-09-02 ENCOUNTER — Encounter (HOSPITAL_BASED_OUTPATIENT_CLINIC_OR_DEPARTMENT_OTHER): Payer: Self-pay | Admitting: Obstetrics & Gynecology

## 2020-09-02 ENCOUNTER — Other Ambulatory Visit: Payer: Self-pay

## 2020-09-02 NOTE — Progress Notes (Signed)
Spoke w/ via phone for pre-op interview---pt Lab needs dos----   none            Lab results------has lab appt 09-06-2020 1100 for cbc t & s serum preg COVID test ------positive covid test 07-26-2020 in epic Arrive at -------1100 am 09-08-2020 NPO after MN NO Solid Food.  Clear liquids from MN until---1000 am then npo Medications to take morning of surgery -----none Diabetic medication -----n/a Patient Special Instructions -----none Pre-Op special Istructions -----none Patient verbalized understanding of instructions that were given at this phone interview. Patient denies shortness of breath, chest pain, fever, cough at this phone interview.

## 2020-09-06 ENCOUNTER — Encounter (HOSPITAL_COMMUNITY)
Admission: RE | Admit: 2020-09-06 | Discharge: 2020-09-06 | Disposition: A | Discharge status: Home / Self Care | Payer: Medicaid Other | Source: Ambulatory Visit | Attending: Obstetrics & Gynecology | Admitting: Obstetrics & Gynecology

## 2020-09-06 ENCOUNTER — Other Ambulatory Visit: Payer: Self-pay

## 2020-09-06 DIAGNOSIS — Z01812 Encounter for preprocedural laboratory examination: Secondary | ICD-10-CM | POA: Insufficient documentation

## 2020-09-06 LAB — TYPE AND SCREEN
ABO/RH(D): A POS
Antibody Screen: NEGATIVE

## 2020-09-06 LAB — CBC
HCT: 36.3 % (ref 36.0–46.0)
Hemoglobin: 11.3 g/dL — ABNORMAL LOW (ref 12.0–15.0)
MCH: 26.9 pg (ref 26.0–34.0)
MCHC: 31.1 g/dL (ref 30.0–36.0)
MCV: 86.4 fL (ref 80.0–100.0)
Platelets: 310 10*3/uL (ref 150–400)
RBC: 4.2 MIL/uL (ref 3.87–5.11)
RDW: 15.6 % — ABNORMAL HIGH (ref 11.5–15.5)
WBC: 7.8 10*3/uL (ref 4.0–10.5)
nRBC: 0 % (ref 0.0–0.2)

## 2020-09-06 LAB — HCG, SERUM, QUALITATIVE: Preg, Serum: NEGATIVE

## 2020-09-06 NOTE — Progress Notes (Signed)
Patient states she may have been pregnant and had a miscarriage pt not sure type and screen only done for 3 days

## 2020-09-08 ENCOUNTER — Ambulatory Visit (HOSPITAL_BASED_OUTPATIENT_CLINIC_OR_DEPARTMENT_OTHER)
Admission: RE | Admit: 2020-09-08 | Discharge: 2020-09-08 | Disposition: A | Discharge status: Home / Self Care | Payer: Medicaid Other | Source: Ambulatory Visit | Attending: Obstetrics & Gynecology | Admitting: Obstetrics & Gynecology

## 2020-09-08 ENCOUNTER — Encounter (HOSPITAL_BASED_OUTPATIENT_CLINIC_OR_DEPARTMENT_OTHER): Payer: Self-pay | Admitting: Obstetrics & Gynecology

## 2020-09-08 ENCOUNTER — Ambulatory Visit (HOSPITAL_BASED_OUTPATIENT_CLINIC_OR_DEPARTMENT_OTHER): Payer: Medicaid Other | Admitting: Anesthesiology

## 2020-09-08 ENCOUNTER — Encounter (HOSPITAL_BASED_OUTPATIENT_CLINIC_OR_DEPARTMENT_OTHER)
Admission: RE | Disposition: A | Discharge status: Home / Self Care | Payer: Self-pay | Source: Ambulatory Visit | Attending: Obstetrics & Gynecology

## 2020-09-08 ENCOUNTER — Other Ambulatory Visit: Payer: Self-pay

## 2020-09-08 DIAGNOSIS — N840 Polyp of corpus uteri: Secondary | ICD-10-CM | POA: Insufficient documentation

## 2020-09-08 DIAGNOSIS — N9489 Other specified conditions associated with female genital organs and menstrual cycle: Secondary | ICD-10-CM

## 2020-09-08 DIAGNOSIS — Z833 Family history of diabetes mellitus: Secondary | ICD-10-CM | POA: Insufficient documentation

## 2020-09-08 DIAGNOSIS — R102 Pelvic and perineal pain: Secondary | ICD-10-CM | POA: Diagnosis not present

## 2020-09-08 DIAGNOSIS — Z87891 Personal history of nicotine dependence: Secondary | ICD-10-CM | POA: Insufficient documentation

## 2020-09-08 DIAGNOSIS — Z8249 Family history of ischemic heart disease and other diseases of the circulatory system: Secondary | ICD-10-CM | POA: Insufficient documentation

## 2020-09-08 DIAGNOSIS — R1909 Other intra-abdominal and pelvic swelling, mass and lump: Secondary | ICD-10-CM | POA: Diagnosis present

## 2020-09-08 DIAGNOSIS — N926 Irregular menstruation, unspecified: Secondary | ICD-10-CM | POA: Diagnosis not present

## 2020-09-08 DIAGNOSIS — Z8616 Personal history of COVID-19: Secondary | ICD-10-CM | POA: Diagnosis not present

## 2020-09-08 HISTORY — PX: HYSTEROSCOPY WITH D & C: SHX1775

## 2020-09-08 LAB — POCT PREGNANCY, URINE: Preg Test, Ur: NEGATIVE

## 2020-09-08 LAB — ABO/RH: ABO/RH(D): A POS

## 2020-09-08 SURGERY — DILATATION AND CURETTAGE /HYSTEROSCOPY
Anesthesia: General | Site: Vagina

## 2020-09-08 MED ORDER — ONDANSETRON HCL 4 MG/2ML IJ SOLN
INTRAMUSCULAR | Status: AC
  Filled 2020-09-08: qty 2

## 2020-09-08 MED ORDER — LIDOCAINE 2% (20 MG/ML) 5 ML SYRINGE
INTRAMUSCULAR | Status: AC
  Filled 2020-09-08: qty 5

## 2020-09-08 MED ORDER — FENTANYL CITRATE (PF) 100 MCG/2ML IJ SOLN
INTRAMUSCULAR | Status: AC
  Filled 2020-09-08: qty 2

## 2020-09-08 MED ORDER — KETOROLAC TROMETHAMINE 30 MG/ML IJ SOLN
INTRAMUSCULAR | Status: AC
  Filled 2020-09-08: qty 1

## 2020-09-08 MED ORDER — DEXAMETHASONE SODIUM PHOSPHATE 10 MG/ML IJ SOLN
INTRAMUSCULAR | Status: DC | PRN
  Administered 2020-09-08: 10 mg via INTRAVENOUS

## 2020-09-08 MED ORDER — PROPOFOL 500 MG/50ML IV EMUL
INTRAVENOUS | Status: DC | PRN
  Administered 2020-09-08: 200 mg via INTRAVENOUS

## 2020-09-08 MED ORDER — MIDAZOLAM HCL 5 MG/5ML IJ SOLN
INTRAMUSCULAR | Status: DC | PRN
  Administered 2020-09-08: 2 mg via INTRAVENOUS

## 2020-09-08 MED ORDER — SCOPOLAMINE 1 MG/3DAYS TD PT72: 1.0000 | MEDICATED_PATCH | TRANSDERMAL | Status: DC

## 2020-09-08 MED ORDER — PHENYLEPHRINE 40 MCG/ML (10ML) SYRINGE FOR IV PUSH (FOR BLOOD PRESSURE SUPPORT)
PREFILLED_SYRINGE | INTRAVENOUS | Status: DC | PRN
  Administered 2020-09-08: 80 ug via INTRAVENOUS

## 2020-09-08 MED ORDER — HYDROCODONE-ACETAMINOPHEN 5-325 MG PO TABS: 1.0000 | ORAL_TABLET | Freq: Four times a day (QID) | ORAL | 0 refills | Status: DC | PRN

## 2020-09-08 MED ORDER — LACTATED RINGERS IV SOLN
INTRAVENOUS | Status: DC
  Administered 2020-09-08: 1000 mL via INTRAVENOUS

## 2020-09-08 MED ORDER — POVIDONE-IODINE 10 % EX SWAB: 2.0000 "application " | Freq: Once | CUTANEOUS | Status: DC

## 2020-09-08 MED ORDER — MIDAZOLAM HCL 2 MG/2ML IJ SOLN
INTRAMUSCULAR | Status: AC
  Filled 2020-09-08: qty 2

## 2020-09-08 MED ORDER — LIDOCAINE-EPINEPHRINE 1 %-1:100000 IJ SOLN
INTRAMUSCULAR | Status: DC | PRN
  Administered 2020-09-08: 5 mL

## 2020-09-08 MED ORDER — SODIUM CHLORIDE 0.9 % IR SOLN
Status: DC | PRN
  Administered 2020-09-08: 3000 mL

## 2020-09-08 MED ORDER — OXYCODONE HCL 5 MG PO TABS
ORAL_TABLET | ORAL | Status: AC
  Filled 2020-09-08: qty 1

## 2020-09-08 MED ORDER — LIDOCAINE 2% (20 MG/ML) 5 ML SYRINGE
INTRAMUSCULAR | Status: DC | PRN
  Administered 2020-09-08: 60 mg via INTRAVENOUS

## 2020-09-08 MED ORDER — DEXAMETHASONE SODIUM PHOSPHATE 10 MG/ML IJ SOLN
INTRAMUSCULAR | Status: AC
  Filled 2020-09-08: qty 1

## 2020-09-08 MED ORDER — FENTANYL CITRATE (PF) 100 MCG/2ML IJ SOLN
25.0000 ug | INTRAMUSCULAR | Status: DC | PRN
  Administered 2020-09-08 (×3): 50 ug via INTRAVENOUS

## 2020-09-08 MED ORDER — KETOROLAC TROMETHAMINE 30 MG/ML IJ SOLN
INTRAMUSCULAR | Status: DC | PRN
  Administered 2020-09-08: 30 mg via INTRAVENOUS

## 2020-09-08 MED ORDER — PROPOFOL 10 MG/ML IV BOLUS
INTRAVENOUS | Status: AC
  Filled 2020-09-08: qty 20

## 2020-09-08 MED ORDER — OXYCODONE HCL 5 MG PO TABS
5.0000 mg | ORAL_TABLET | Freq: Once | ORAL | Status: AC
  Administered 2020-09-08: 5 mg via ORAL

## 2020-09-08 MED ORDER — ONDANSETRON HCL 4 MG/2ML IJ SOLN
INTRAMUSCULAR | Status: DC | PRN
  Administered 2020-09-08: 4 mg via INTRAVENOUS

## 2020-09-08 MED ORDER — FENTANYL CITRATE (PF) 100 MCG/2ML IJ SOLN
INTRAMUSCULAR | Status: DC | PRN
  Administered 2020-09-08 (×2): 50 ug via INTRAVENOUS

## 2020-09-08 MED ORDER — PHENYLEPHRINE 40 MCG/ML (10ML) SYRINGE FOR IV PUSH (FOR BLOOD PRESSURE SUPPORT)
PREFILLED_SYRINGE | INTRAVENOUS | Status: AC
  Filled 2020-09-08: qty 10

## 2020-09-08 MED ORDER — AMISULPRIDE (ANTIEMETIC) 5 MG/2ML IV SOLN: 10.0000 mg | Freq: Once | INTRAVENOUS | Status: DC | PRN

## 2020-09-08 MED ORDER — IBUPROFEN 800 MG PO TABS: 800.0000 mg | ORAL_TABLET | Freq: Three times a day (TID) | ORAL | 0 refills | Status: DC | PRN

## 2020-09-08 MED ORDER — ACETAMINOPHEN 500 MG PO TABS: 1000.0000 mg | ORAL_TABLET | Freq: Once | ORAL | Status: DC

## 2020-09-08 SURGICAL SUPPLY — 16 items
BIPOLAR CUTTING LOOP 21FR (ELECTRODE)
CATH ROBINSON RED A/P 16FR (CATHETERS) ×2 IMPLANT
COVER WAND RF STERILE (DRAPES) ×2 IMPLANT
DEVICE MYOSURE LITE (MISCELLANEOUS) ×2 IMPLANT
GAUZE 4X4 16PLY RFD (DISPOSABLE) ×2 IMPLANT
GLOVE SURG LTX SZ6.5 (GLOVE) ×4 IMPLANT
GLOVE SURG UNDER POLY LF SZ7 (GLOVE) ×6 IMPLANT
GOWN STRL REUS W/TWL LRG LVL3 (GOWN DISPOSABLE) ×4 IMPLANT
IV NS IRRIG 3000ML ARTHROMATIC (IV SOLUTION) ×2 IMPLANT
KIT PROCEDURE FLUENT (KITS) ×2 IMPLANT
KIT TURNOVER CYSTO (KITS) ×2 IMPLANT
LOOP CUTTING BIPOLAR 21FR (ELECTRODE) IMPLANT
PACK VAGINAL MINOR WOMEN LF (CUSTOM PROCEDURE TRAY) ×2 IMPLANT
PAD OB MATERNITY 4.3X12.25 (PERSONAL CARE ITEMS) ×2 IMPLANT
SEAL ROD LENS SCOPE MYOSURE (ABLATOR) ×2 IMPLANT
TOWEL OR 17X26 10 PK STRL BLUE (TOWEL DISPOSABLE) ×2 IMPLANT

## 2020-09-08 NOTE — Op Note (Signed)
09/08/2020  2:43 PM  PATIENT:  Emily Yu  25 y.o. female  PRE-OPERATIVE DIAGNOSIS:  Endometrial Mass Pelvic Pain  POST-OPERATIVE DIAGNOSIS:  Endometrial Mass Pelvic Pain  PROCEDURE:  Procedure(s): DILATATION AND CURETTAGE /HYSTEROSCOPY AND RESECTION OF ENDOMETRIAL MASS  SURGEON:  Jerene Bears  ASSISTANTS: OR staff.    ANESTHESIA:   general  ESTIMATED BLOOD LOSS: 5 mL  BLOOD ADMINISTERED:none   FLUIDS: 600  UOP: 20cc, pt voided right before going back to the OR  SPECIMEN:  Endometrial mass, appears to be a polyp, and endometrial curettings  DISPOSITION OF SPECIMEN:  PATHOLOGY  FINDINGS: vascular appearing lesion that is most consistent with a polyp  DESCRIPTION OF OPERATION: Patient was taken to the operating room.  She is placed in the supine position. SCDs were on her lower extremities and functioning properly. General anesthesia with an LMA was administered without difficulty. Dr. Sampson Goon, anesthesia, oversaw case.  Legs were then placed in the [facility] stirrups in the low lithotomy position. The legs were lifted to the high lithotomy position and the Betadine prep was used on the inner thighs perineum and vagina x3. Patient was draped in a normal standard fashion. An in and out catheterization with a red rubber Foley catheter was performed. Approximately 20 cc of clear urine was noted. A bivalve speculum was placed the vagina. The anterior lip of the cervix was grasped with single-tooth tenaculum.  A paracervical block of 1% lidocaine mixed one-to-one with epinephrine (1:100,000 units).  10 cc was used total. The cervix is dilated up to #[facility] dilators. The endometrial cavity sounded to 7.5 cm.   A Myosure diagnostic hysteroscope was obtained. Normal saline was used as a hysteroscopic fluid. The hysteroscope was advanced through the endocervical canal into the endometrial cavity. The tubal ostia were noted bilaterally. Additional findings included vascular endometrial  mass coming from the anterior region of the endometrial cavity.  Using the Myosure lite device, the lesion was fully resected.  The hysteroscope was removed. A #1 smooth curette was used to curette the cavity until rough gritty texture was noted in all quadrants. With revisualization of the hysteroscope, there was no longer any abnormal findings.  At this point no other procedure was needed and this procedure was ended. The hysteroscope was removed. The fluid deficit was 170 cc. The tenaculum was removed from the anterior lip of the cervix. The speculum was removed from the vagina. The prep was cleansed of the patient's skin. The legs are positioned back in the supine position. Sponge, lap, needle, initially counts were correct x2. Patient was taken to recovery in stable condition.  COUNTS:  YES  PLAN OF CARE: Transfer to PACU

## 2020-09-08 NOTE — Transfer of Care (Signed)
Immediate Anesthesia Transfer of Care Note  Patient: Emily Yu  Procedure(s) Performed: DILATATION AND CURETTAGE /HYSTEROSCOPY AND RESECTION OF ENDOMETRIAL MASS (N/A Vagina )  Patient Location: PACU  Anesthesia Type:General  Level of Consciousness: awake, alert , oriented and patient cooperative  Airway & Oxygen Therapy: Patient Spontanous Breathing  Post-op Assessment: Report given to RN and Post -op Vital signs reviewed and stable  Post vital signs: Reviewed and stable  Last Vitals:  Vitals Value Taken Time  BP    Temp    Pulse 84 09/08/20 1442  Resp 16 09/08/20 1442  SpO2 100 % 09/08/20 1442  Vitals shown include unvalidated device data.  Last Pain:  Vitals:   09/08/20 1143  TempSrc: Oral  PainSc: 8       Patients Stated Pain Goal: 5 (09/08/20 1143)  Complications: No complications documented.

## 2020-09-08 NOTE — Anesthesia Preprocedure Evaluation (Signed)
Anesthesia Evaluation  Patient identified by MRN, date of birth, ID band Patient awake    Reviewed: Allergy & Precautions, NPO status , Patient's Chart, lab work & pertinent test results  Airway Mallampati: II  TM Distance: >3 FB Neck ROM: Full    Dental  (+) Dental Advisory Given   Pulmonary former smoker,    breath sounds clear to auscultation       Cardiovascular negative cardio ROS   Rhythm:Regular Rate:Normal     Neuro/Psych negative neurological ROS     GI/Hepatic negative GI ROS, Neg liver ROS,   Endo/Other  negative endocrine ROS  Renal/GU negative Renal ROS     Musculoskeletal   Abdominal   Peds  Hematology  (+) anemia ,   Anesthesia Other Findings   Reproductive/Obstetrics                             Anesthesia Physical Anesthesia Plan  ASA: II  Anesthesia Plan: General   Post-op Pain Management:    Induction: Intravenous  PONV Risk Score and Plan: 4 or greater and Ondansetron, Dexamethasone, Midazolam, Scopolamine patch - Pre-op and Treatment may vary due to age or medical condition  Airway Management Planned: LMA  Additional Equipment:   Intra-op Plan:   Post-operative Plan: Extubation in OR  Informed Consent: I have reviewed the patients History and Physical, chart, labs and discussed the procedure including the risks, benefits and alternatives for the proposed anesthesia with the patient or authorized representative who has indicated his/her understanding and acceptance.     Dental advisory given  Plan Discussed with: CRNA  Anesthesia Plan Comments:         Anesthesia Quick Evaluation

## 2020-09-08 NOTE — Anesthesia Procedure Notes (Signed)
Procedure Name: LMA Insertion Date/Time: 09/08/2020 2:12 PM Performed by: Bishop Limbo, CRNA Pre-anesthesia Checklist: Patient identified, Emergency Drugs available, Suction available and Patient being monitored Patient Re-evaluated:Patient Re-evaluated prior to induction Oxygen Delivery Method: Circle System Utilized Preoxygenation: Pre-oxygenation with 100% oxygen Induction Type: IV induction Ventilation: Mask ventilation without difficulty LMA: LMA inserted LMA Size: 4.0 Number of attempts: 1 Placement Confirmation: positive ETCO2 Tube secured with: Tape Dental Injury: Teeth and Oropharynx as per pre-operative assessment

## 2020-09-08 NOTE — H&P (Signed)
Emily Yu is an 25 y.o. 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  Hysteroscopy with resection of lesion recommended.  Pt was scheduled but had positive covid test so procedure was rescheduled.  Last cycle was 08/20/2020 and was three days but very heavy.  She continues to have cramping and pelvic pain.  She does have chronic constipation as well.  GI referral has been placed but not scheduled yet.    Procedure reviewed with risks and benefits discussed.  Questions answered and pt ready to proceed.  Pertinent Gynecological History: Menses: irregular since miscarriage Contraception: none Blood transfusions: none Sexually transmitted diseases: no past history Previous GYN Procedures: none  Last pap: normal Date: 06/28/2020 OB History: G1, P0, A1   Menstrual History: Patient's last menstrual period was 08/20/2020.    Past Medical History:  Diagnosis Date  . Anemia   . Concussion 12 yrs ago   no residual had temporary memory loss  . COVID 07/26/2020   bulge on neck x 7 days all symptoms resolved  . Endometrial mass     Past Surgical History:  Procedure Laterality Date  . wisdome teeth exaction    . WRIST SURGERY Left 12 yrs ago   had pins later removed    Family History  Problem Relation Age of Onset  . Diabetes Other   . Hypertension Other     Social History:  reports that she has quit smoking. Her smoking use included e-cigarettes. She has never used smokeless tobacco. She reports current alcohol use. She reports previous drug use.  Allergies:  Allergies  Allergen Reactions  . Lactose Intolerance (Gi) Other (See Comments)    Stomach pain, cramping, nausea    Medications Prior to Admission  Medication Sig Dispense Refill Last Dose  . ibuprofen (ADVIL) 800 MG tablet Take 800 mg by mouth every 8 (eight) hours as needed.       Review of Systems  All other systems reviewed and are negative.   Blood pressure 132/84, pulse  (!) 105, temperature 98.8 F (37.1 C), temperature source Oral, resp. rate (!) 24, height 5' 4.5" (1.638 m), weight 84.2 kg, last menstrual period 08/20/2020, SpO2 99 %. Physical Exam Vitals reviewed.  Constitutional:      Appearance: Normal appearance.  Cardiovascular:     Rate and Rhythm: Normal rate and regular rhythm.     Pulses: Normal pulses.     Heart sounds: Normal heart sounds.  Pulmonary:     Effort: Pulmonary effort is normal.     Breath sounds: Normal breath sounds.  Skin:    General: Skin is warm.  Neurological:     General: No focal deficit present.     Mental Status: She is alert.     Results for orders placed or performed during the hospital encounter of 09/08/20 (from the past 24 hour(s))  Pregnancy, urine POC     Status: None   Collection Time: 09/08/20 11:23 AM  Result Value Ref Range   Preg Test, Ur NEGATIVE NEGATIVE  ABO/Rh     Status: None   Collection Time: 09/08/20 11:55 AM  Result Value Ref Range   ABO/RH(D)      A POS Performed at [facility], [address], [place], [place] [address]     No results found.  Assessment/Plan: 25 yo G 1A1 engaged AA female with vascular endometrial lesion noted on ultrasound in January here for resection after procedure was rescheduled due to +  pre op Covid test 07/27/2019.  Pt ready to proceed.  Jerene Bears 09/08/2020, 1:45 PM

## 2020-09-08 NOTE — Discharge Instructions (Addendum)
Post-surgical Instructions, Outpatient Surgery  You may expect to feel dizzy, weak, and drowsy for as long as 24 hours after receiving the medicine that made you sleep (anesthetic). For the first 24 hours after your surgery:    Do not drive a car, ride a bicycle, participate in physical activities, or take public transportation until you are done taking narcotic pain medicines or as directed by Dr. Hyacinth Meeker.   Do not drink alcohol or take tranquilizers.   Do not take medicine that has not been prescribed by your physicians.   Do not sign important papers or make important decisions while on narcotic pain medicines.   Have a responsible person with you.   PAIN MANAGEMENT  Motrin 800mg .  (This is the same as 4-200mg  over the counter tablets of Motrin or ibuprofen.)  You may take this every eight hours or as needed for cramping.    Vicodin 5/325mg .  For more severe pain, take one tablet every six hours as needed for pain control.  (Remember that narcotic pain medications increase your risk of constipation.  If this becomes a problem, you may take an over the counter stool softener like Colace 100mg  up to four times a day.)  DO'S AND DON'T'S  Do not take a tub bath for one week.  You may shower on the first day after your surgery  Do not do any heavy lifting for one to two weeks.  This increases the chance of bleeding.  Do move around as you feel able.  Stairs are fine.  You may begin to exercise again as you feel able.  Do not lift any weights for two weeks.  Do not put anything in the vagina for two weeks--no tampons, intercourse, or douching.    REGULAR MEDIATIONS/VITAMINS:  You may restart all of your regular medications as prescribed.  You may restart all of your vitamins as you normally take them.    PLEASE CALL OR SEEK MEDICAL CARE IF:  You have persistent nausea and vomiting.   You have trouble eating or drinking.   You have an oral temperature above 100.5.   You have  constipation that is not helped by adjusting diet or increasing fluid intake. Pain medicines are a common cause of constipation.   You have heavy vaginal bleeding    Post Anesthesia Home Care Instructions  Activity: Get plenty of rest for the remainder of the day. A responsible individual must stay with you for 24 hours following the procedure.  For the next 24 hours, DO NOT: -Drive a car - -Drink alcoholic beverages -Take any medication unless instructed by your physician -Make any legal decisions or sign important papers.  Meals: Start with liquid foods such as gelatin or soup. Progress to regular foods as tolerated. Avoid greasy, spicy, heavy foods. If nausea and/or vomiting occur, drink only clear liquids until the nausea and/or vomiting subsides. Call your physician if vomiting continues.  Special Instructions/Symptoms: Your throat may feel dry or sore from the anesthesia or the breathing tube placed in your throat during surgery. If this causes discomfort, gargle with warm salt water. The discomfort should disappear within 24 hours.

## 2020-09-08 NOTE — Progress Notes (Signed)
Pt called to review dos arrival time 1100 am and verbalized understanding

## 2020-09-09 ENCOUNTER — Encounter (HOSPITAL_BASED_OUTPATIENT_CLINIC_OR_DEPARTMENT_OTHER): Payer: Self-pay | Admitting: Obstetrics & Gynecology

## 2020-09-09 LAB — SURGICAL PATHOLOGY

## 2020-09-09 NOTE — Anesthesia Postprocedure Evaluation (Signed)
Anesthesia Post Note  Patient: LORALEI RADCLIFFE  Procedure(s) Performed: DILATATION AND CURETTAGE /HYSTEROSCOPY AND RESECTION OF ENDOMETRIAL MASS (N/A Vagina )     Patient location during evaluation: PACU Anesthesia Type: General Level of consciousness: awake and alert Pain management: pain level controlled Vital Signs Assessment: post-procedure vital signs reviewed and stable Respiratory status: spontaneous breathing, nonlabored ventilation, respiratory function stable and patient connected to nasal cannula oxygen Cardiovascular status: blood pressure returned to baseline and stable Postop Assessment: no apparent nausea or vomiting Anesthetic complications: no   No complications documented.  Last Vitals:  Vitals:   09/08/20 1530 09/08/20 1615  BP: 115/78 124/82  Pulse: 84 87  Resp: 14 14  Temp:  36.9 C  SpO2: 97% 95%    Last Pain:  Vitals:   09/08/20 1615  TempSrc:   PainSc: 5                  Kennieth Rad

## 2020-09-13 ENCOUNTER — Other Ambulatory Visit (HOSPITAL_BASED_OUTPATIENT_CLINIC_OR_DEPARTMENT_OTHER): Payer: Self-pay

## 2020-09-13 ENCOUNTER — Telehealth (HOSPITAL_BASED_OUTPATIENT_CLINIC_OR_DEPARTMENT_OTHER): Payer: Self-pay | Admitting: Obstetrics & Gynecology

## 2020-09-13 NOTE — Telephone Encounter (Signed)
Patient called back stating she started having blood clots  two days ago and a lot pain  .Patient stated she needs refill for pain medications to be sent to [facility] in [place].

## 2020-09-13 NOTE — Telephone Encounter (Signed)
Called patient and left message to please call the office to schedule her one month post op appointment with Dr.Miller

## 2020-09-14 ENCOUNTER — Encounter: Payer: Self-pay | Admitting: *Deleted

## 2020-09-14 NOTE — Telephone Encounter (Addendum)
I have called the patient three times this morning with no response as it goes straight to voicemail, message left.  I will send a MyChart message.  KW CMA

## 2020-09-14 NOTE — Telephone Encounter (Signed)
She had a hysteroscopy and polyp resection.  She should not need any more narcotics.  They cannot be sent in unless she is seen anyway.  I'm routing this to Sherrilyn Rist as pt may need office visit.  I will text Sherrilyn Rist as well.  Pt has chronic constipation and also need referral to GI.  I placed this but she's only been called once.  Can GI please be called as well to get this pt in with them.  Thank you.

## 2020-09-16 ENCOUNTER — Telehealth (HOSPITAL_BASED_OUTPATIENT_CLINIC_OR_DEPARTMENT_OTHER): Payer: Self-pay | Admitting: Obstetrics & Gynecology

## 2020-09-16 NOTE — Telephone Encounter (Signed)
Patient called scheduled her appointment for 09/23/2020 and stated she would like to have  refill of pain medication call in for her.

## 2020-09-17 ENCOUNTER — Encounter (HOSPITAL_BASED_OUTPATIENT_CLINIC_OR_DEPARTMENT_OTHER): Payer: Self-pay

## 2020-09-17 ENCOUNTER — Telehealth (HOSPITAL_BASED_OUTPATIENT_CLINIC_OR_DEPARTMENT_OTHER): Payer: Self-pay | Admitting: Obstetrics & Gynecology

## 2020-09-17 NOTE — Telephone Encounter (Signed)
Called patient and left a message to call the office to get appointment to be seen on Monday 09/20/2020.

## 2020-09-20 NOTE — Telephone Encounter (Signed)
Called patient today an left another message that the provider has a open for today at 11:15 am and to please call the office to schedulede .

## 2020-09-20 NOTE — Telephone Encounter (Signed)
Pt has been informed that no narcotic can be called into Epic.

## 2020-09-22 ENCOUNTER — Telehealth (HOSPITAL_BASED_OUTPATIENT_CLINIC_OR_DEPARTMENT_OTHER): Payer: Self-pay | Admitting: Obstetrics & Gynecology

## 2020-09-22 NOTE — Telephone Encounter (Signed)
Patient was informed by Dr Hyacinth Meeker no Rx called in.  Apt 09/23/20 KW CMA

## 2020-09-22 NOTE — Telephone Encounter (Signed)
Patient called for a doctors note today for work  reminded the  patient that she has a appointment tomorrow and you will give her a note. Patient stated she needs a note today .Patient left a message on nurse line . Patient has appointment on 11/01/2020 for GI .

## 2020-09-23 ENCOUNTER — Telehealth (HOSPITAL_BASED_OUTPATIENT_CLINIC_OR_DEPARTMENT_OTHER): Payer: Self-pay | Admitting: *Deleted

## 2020-09-23 ENCOUNTER — Ambulatory Visit (HOSPITAL_BASED_OUTPATIENT_CLINIC_OR_DEPARTMENT_OTHER): Payer: Medicaid Other | Admitting: Obstetrics & Gynecology

## 2020-09-23 NOTE — Telephone Encounter (Signed)
LMOVM that in order to get a return to work note, pt will need to be seen in office for a post op appt. Pt did not show up for appt today. Advised her to call and reschedule.

## 2020-11-01 ENCOUNTER — Ambulatory Visit (INDEPENDENT_AMBULATORY_CARE_PROVIDER_SITE_OTHER): Payer: Medicaid Other | Admitting: Gastroenterology

## 2020-11-01 ENCOUNTER — Encounter: Payer: Self-pay | Admitting: Gastroenterology

## 2020-11-01 VITALS — BP 120/82 | HR 83 | Ht 64.0 in | Wt 190.6 lb

## 2020-11-01 DIAGNOSIS — K5900 Constipation, unspecified: Secondary | ICD-10-CM

## 2020-11-01 MED ORDER — LINACLOTIDE 145 MCG PO CAPS: 145.0000 ug | ORAL_CAPSULE | Freq: Every day | ORAL | 0 refills | Status: DC

## 2020-11-01 NOTE — Patient Instructions (Addendum)
It was a pleasure to meet you today. Based on our discussion, I am providing you with my recommendations below:  RECOMMENDATION(S):   I am providing you with samples of Linzess today.  PRESCRIPTION MEDICATION(S):   We have sent the following medication(s) to your pharmacy:  . Emily Yu - please take daily  NOTE: If your medication(s) requires a PRIOR AUTHORIZATION, we will receive notification from your pharmacy. Once received, the process to submit for approval may take up to 7-10 business days. You will be contacted about any denials we have received from your insurance company as well as alternatives recommended by your provider.  FOLLOW UP:  . I would like for you to follow up with me in 4-6 weeks, earlier if needed. Please call the office at [phone] to schedule your appointment.  BMI:  . If you are age 39 or younger, your body mass index should be between 19-25. Your Body mass index is 32.72 kg/m. If this is out of the aformentioned range listed, please consider follow up with your Primary Care Provider.   Thank you for trusting me with your gastrointestinal care!    Tressia Danas, MD, MPH

## 2020-11-01 NOTE — Progress Notes (Signed)
Referring Provider: Jerene Bears, MD Primary Care Physician:  System, Provider Not In  Reason for Consultation:  Constipation   IMPRESSION:  Chronic constipation: No alarm features. 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  No improvement using Miralax, magnesium citrate, and dulcolax. Trial of Linzess. Reviewed frequent side effects, including onset of diarrhea. I've asked her to try to stick with a trial of medications as her system adjusts.   PLAN: - High fiber diet, drink at least 64 ounces of water daily, exercise regularly - Linzess 145 mcg daily (samples provided today) - Follow-up in 4-6 weeks, earlier if needed - Consider further evaluation and treatment for pelvic floor dyssynergia in the future  Please see the "Patient Instructions" section for addition details about the plan.  HPI: Emily Yu is a 25 y.o. female referred by Dr. Hyacinth Meeker for further evaluation of constipation. The history is obtained through the patient and review of her medical record.  Recently evaluated by Dr. Hyacinth Meeker for pelvic pain.  Had D&C/hysteroscopy for polyp resection 09/08/20.  Reports a lifetime history of constipation.  Frequently goes 4-7 days between bowel movements. Progressive bloating and rare nausea between episodes that is relieved with defecation. Frequent sense of incomplete evacuation. Sometimes, when she finally has a bowel movement it tears on the way out. No manual assistance with defecation.  No blood or mucus with defecation.  No improvement using Miralax, magnesium citrate, and dulcolax.  Previously on iron supplements because it was worsening her constipation  Labs 06/28/20: normal calcium, normal CMP, TSH 2.49 Labs 09/06/20: normal CBC except hemoglobin of 11.3, up from 10.2 09/17/18  Abdominal x-ray 03/23/2019 for abdominal pain showed no bowel abnormalities CT abd/pelvis with contrast 12/25/2016 showed a moderate about of retained stool in the colon  No known family  history of colon cancer or polyps. No family history of uterine/endometrial cancer, pancreatic cancer or gastric/stomach cancer.   Past Medical History:  Diagnosis Date  . Anemia   . Concussion 12 yrs ago   no residual had temporary memory loss  . COVID 07/26/2020   bulge on neck x 7 days all symptoms resolved  . Endometrial mass     Past Surgical History:  Procedure Laterality Date  . HYSTEROSCOPY WITH D & C N/A 09/08/2020   Procedure: DILATATION AND CURETTAGE /HYSTEROSCOPY AND RESECTION OF ENDOMETRIAL MASS;  Surgeon: Jerene Bears, MD;  Location: [facility];  Service: Gynecology;  Laterality: N/A;  . wisdome teeth exaction    . WRIST SURGERY Left 12 yrs ago   had pins later removed    Current Outpatient Medications  Medication Sig Dispense Refill  . linaclotide (LINZESS) 145 MCG CAPS capsule Take 1 capsule (145 mcg total) by mouth daily before breakfast. 30 capsule 0   No current facility-administered medications for this visit.    Allergies as of 11/01/2020 - Review Complete 11/01/2020  Allergen Reaction Noted  . Lactose intolerance (gi) Other (See Comments) 04/20/2013    Family History  Problem Relation Age of Onset  . Diabetes Other   . Hypertension Other     Social History   Socioeconomic History  . Marital status: Single    Spouse name: Not on file  . Number of children: Not on file  . Years of education: Not on file  . Highest education level: Not on file  Occupational History  . Not on file  Tobacco Use  . Smoking status: Former Smoker    Types: E-cigarettes  .  Smokeless tobacco: Never Used  Vaping Use  . Vaping Use: Some days  . Substances: Nicotine, CBD  Substance and Sexual Activity  . Alcohol use: Yes    Comment: occ  . Drug use: Not Currently  . Sexual activity: Yes    Birth control/protection: None  Other Topics Concern  . Not on file  Social History Narrative  . Not on file   Social Determinants of Health   Financial  Resource Strain: Not on file  Food Insecurity: No Food Insecurity  . Worried About Programme researcher, broadcasting/film/video in the Last Year: Never true  . Ran Out of Food in the Last Year: Never true  Transportation Needs: No Transportation Needs  . Lack of Transportation (Medical): No  . Lack of Transportation (Non-Medical): No  Physical Activity: Not on file  Stress: Not on file  Social Connections: Not on file  Intimate Partner Violence: Not on file    Review of Systems: 12 system ROS is negative except as noted above.   Physical Exam: General:   Alert,  well-nourished, pleasant and cooperative in NAD Head:  Normocephalic and atraumatic. Eyes:  Sclera clear, no icterus.   Conjunctiva pink. Ears:  Normal auditory acuity. Nose:  No deformity, discharge,  or lesions. Mouth:  No deformity or lesions.   Neck:  Supple; no masses or thyromegaly. Lungs:  Clear throughout to auscultation.   No wheezes. Heart:  Regular rate and rhythm; no murmurs. Abdomen:  Soft,nontender, nondistended, normal bowel sounds, no rebound or guarding. No hepatosplenomegaly.   Rectal:  Deferred  Msk:  Symmetrical. No boney deformities LAD: No inguinal or umbilical LAD Extremities:  No clubbing or edema. Neurologic:  Alert and  oriented x4;  grossly nonfocal Skin:  Intact without significant lesions or rashes. Psych:  Alert and cooperative. Normal mood and affect.    Jahmeer Porche L. Orvan Falconer, MD, MPH 11/01/2020, 1:05 PM

## 2020-11-05 ENCOUNTER — Encounter (HOSPITAL_BASED_OUTPATIENT_CLINIC_OR_DEPARTMENT_OTHER): Payer: Self-pay | Admitting: Obstetrics & Gynecology

## 2020-11-05 ENCOUNTER — Other Ambulatory Visit: Payer: Self-pay

## 2020-11-05 ENCOUNTER — Ambulatory Visit (INDEPENDENT_AMBULATORY_CARE_PROVIDER_SITE_OTHER): Payer: Medicaid Other | Admitting: Obstetrics & Gynecology

## 2020-11-05 VITALS — BP 135/83 | HR 86 | Ht 65.0 in | Wt 190.0 lb

## 2020-11-05 DIAGNOSIS — N926 Irregular menstruation, unspecified: Secondary | ICD-10-CM | POA: Diagnosis not present

## 2020-11-05 DIAGNOSIS — Z3202 Encounter for pregnancy test, result negative: Secondary | ICD-10-CM

## 2020-11-05 DIAGNOSIS — R102 Pelvic and perineal pain: Secondary | ICD-10-CM

## 2020-11-05 DIAGNOSIS — K5904 Chronic idiopathic constipation: Secondary | ICD-10-CM

## 2020-11-05 DIAGNOSIS — Z9889 Other specified postprocedural states: Secondary | ICD-10-CM

## 2020-11-05 DIAGNOSIS — N9489 Other specified conditions associated with female genital organs and menstrual cycle: Secondary | ICD-10-CM

## 2020-11-05 LAB — POCT URINE PREGNANCY: Preg Test, Ur: NEGATIVE

## 2020-11-07 DIAGNOSIS — Z9889 Other specified postprocedural states: Secondary | ICD-10-CM | POA: Insufficient documentation

## 2020-11-07 DIAGNOSIS — N926 Irregular menstruation, unspecified: Secondary | ICD-10-CM | POA: Insufficient documentation

## 2020-11-07 NOTE — Progress Notes (Signed)
GYNECOLOGY  VISIT  CC:   Recheck after hysteroscopy  HPI: 25 y.o. [id number] Single Black or [place] American female here for recheck after undergoing hysteroscopy with polyp resection on 09/08/2020.  She reports bleeding is fairly regular and improved.  She has seen GI and started on Linzess.  This has already helped bowel movements and pain is already improved as well.  Bladder function is normal.    We reviewed pictures and pathology.  Questions answered.    Pt interested in conceiving.  Is not taking PNV so recommended starting.  She is doing app monitoring of fertility time.  I reviewed this with her but this is not based on any input, just based on typical 28 day cycle.  Ovulation predictor testing discussed with pt.  Options discussed.  Would recommend pt doing this for next 2-3 months and keeping track of results.  If not having regular ovulation, would then proceed with fertility testing and ovulation induction if appropriate.  Discussed all of this.  Pt comfortable with plan.  Boyfriend accompanied her today.   MEDS:   Current Outpatient Medications on File Prior to Visit  Medication Sig Dispense Refill  . linaclotide (LINZESS) 145 MCG CAPS capsule Take 1 capsule (145 mcg total) by mouth daily before breakfast. 30 capsule 0   No current facility-administered medications on file prior to visit.    SH:  Smoking No    PHYSICAL EXAMINATION:    BP 135/83 (BP Location: Right Arm, Patient Position: Sitting, Cuff Size: Large)   Pulse 86   Ht 5\' 5"  (1.651 m)   Wt 190 lb (86.2 kg)   BMI 31.62 kg/m     General appearance: alert, cooperative and appears stated age Abdomen: soft, non-tender; bowel sounds normal; no masses,  no organomegaly Incisions:  C/D/I  Pelvic: External genitalia:  no lesions              Urethra:  normal appearing urethra with no masses, tenderness or lesions              Bartholins and Skenes: normal                 Vagina: normal appearing vagina with normal  color and discharge, no lesions              Cervix: no lesions              Bimanual Exam:  Uterus:  normal size, contour, position, consistency, mobility, non-tender              Adnexa: no mass, fullness, tenderness  Chaperone, , CMA, was present for exam.  Assessment/Plan: 1. Irregular bleeding - pt will continue to monitor cycles.  Ovulation testing recommended. - recommended starting PNV as well - recheck 3 months  2. Pelvic pain  3. Chronic idiopathic constipation - followed now by GI and on Lizess  4. History of hysteroscopy with polyp resection  Total time with pt: 22 minutes

## 2020-12-29 ENCOUNTER — Ambulatory Visit: Payer: Medicaid Other | Admitting: Gastroenterology

## 2021-02-19 ENCOUNTER — Other Ambulatory Visit: Payer: Self-pay

## 2021-02-19 ENCOUNTER — Emergency Department (HOSPITAL_COMMUNITY)
Admission: EM | Admit: 2021-02-19 | Discharge: 2021-02-19 | Disposition: A | Discharge status: Home / Self Care | Payer: Medicaid Other | Attending: Emergency Medicine | Admitting: Emergency Medicine

## 2021-02-19 ENCOUNTER — Telehealth (HOSPITAL_COMMUNITY): Payer: Self-pay | Admitting: Student

## 2021-02-19 ENCOUNTER — Encounter (HOSPITAL_COMMUNITY): Payer: Self-pay | Admitting: *Deleted

## 2021-02-19 DIAGNOSIS — Z8616 Personal history of COVID-19: Secondary | ICD-10-CM | POA: Diagnosis not present

## 2021-02-19 DIAGNOSIS — S0500XA Injury of conjunctiva and corneal abrasion without foreign body, unspecified eye, initial encounter: Secondary | ICD-10-CM

## 2021-02-19 DIAGNOSIS — R519 Headache, unspecified: Secondary | ICD-10-CM | POA: Diagnosis not present

## 2021-02-19 DIAGNOSIS — X58XXXA Exposure to other specified factors, initial encounter: Secondary | ICD-10-CM | POA: Insufficient documentation

## 2021-02-19 DIAGNOSIS — R000 Tachycardia, unspecified: Secondary | ICD-10-CM | POA: Insufficient documentation

## 2021-02-19 DIAGNOSIS — S0501XA Injury of conjunctiva and corneal abrasion without foreign body, right eye, initial encounter: Secondary | ICD-10-CM | POA: Diagnosis not present

## 2021-02-19 DIAGNOSIS — S0591XA Unspecified injury of right eye and orbit, initial encounter: Secondary | ICD-10-CM | POA: Diagnosis present

## 2021-02-19 DIAGNOSIS — Z87891 Personal history of nicotine dependence: Secondary | ICD-10-CM | POA: Diagnosis not present

## 2021-02-19 DIAGNOSIS — S0502XA Injury of conjunctiva and corneal abrasion without foreign body, left eye, initial encounter: Secondary | ICD-10-CM | POA: Insufficient documentation

## 2021-02-19 DIAGNOSIS — K5900 Constipation, unspecified: Secondary | ICD-10-CM

## 2021-02-19 MED ORDER — FLUORESCEIN SODIUM 1 MG OP STRP
1.0000 | ORAL_STRIP | Freq: Once | OPHTHALMIC | Status: AC
  Administered 2021-02-19: 1 via OPHTHALMIC
  Filled 2021-02-19: qty 1

## 2021-02-19 MED ORDER — LINACLOTIDE 145 MCG PO CAPS: 145.0000 ug | ORAL_CAPSULE | Freq: Every day | ORAL | 0 refills | Status: DC

## 2021-02-19 MED ORDER — OFLOXACIN 0.3 % OP SOLN: 1.0000 [drp] | Freq: Four times a day (QID) | OPHTHALMIC | 0 refills | Status: DC

## 2021-02-19 MED ORDER — OXYCODONE-ACETAMINOPHEN 5-325 MG PO TABS: 1.0000 | ORAL_TABLET | Freq: Four times a day (QID) | ORAL | 0 refills | Status: DC | PRN

## 2021-02-19 MED ORDER — TETRACAINE HCL 0.5 % OP SOLN
2.0000 [drp] | Freq: Once | OPHTHALMIC | Status: AC
  Administered 2021-02-19: 2 [drp] via OPHTHALMIC
  Filled 2021-02-19: qty 4

## 2021-02-19 MED ORDER — OXYCODONE-ACETAMINOPHEN 5-325 MG PO TABS
1.0000 | ORAL_TABLET | Freq: Once | ORAL | Status: AC
  Administered 2021-02-19: 1 via ORAL
  Filled 2021-02-19: qty 1

## 2021-02-19 NOTE — ED Notes (Signed)
Pt ambulated to BR with steady gait. Fiance assisting.

## 2021-02-19 NOTE — ED Notes (Signed)
[place] PA notified of [employer], [employer], and eye drops at bedside.

## 2021-02-19 NOTE — ED Triage Notes (Signed)
The pt is c/o bi-lateral eye pain since yesterday  no known history    she has not gotten anything in her eyes she is c/o extreme pain when she attempts to open her eyes even in a dark room

## 2021-02-19 NOTE — Discharge Instructions (Addendum)
Place 1 drop of ofloxacin solution to each eye up to 4 times daily. Take Percocet every 6 hours as needed for pain. Things have not improved by Monday please make sure you have an appointment to follow-up with an ophthalmologist.  Information is listed above if you do not have 1 g.

## 2021-02-19 NOTE — ED Provider Notes (Signed)
[facility] EMERGENCY DEPARTMENT Provider Note   CSN: [id number] Arrival date & time: 02/19/21  1624     History Chief Complaint  Patient presents with   Eye Pain    Emily Yu is a 25 y.o. female.  HPI  Patient presents with bilateral eye pain.  Started yesterday, has been getting progressively worse.  Has tried antihistamine, no relief.  Pain is worsened with light and with keeping her eyes open.  She wears contacts occasionally, has not worn them in the last 2 days.  Denies inciting event, has not used any new eyedrops or solutions.  Denies any chemical exposures.  There is associated headache, no other associated symptoms.  Past Medical History:  Diagnosis Date   Anemia    Concussion 12 yrs ago   no residual had temporary memory loss   COVID 07/26/2020   bulge on neck x 7 days all symptoms resolved   Endometrial mass     Patient Active Problem List   Diagnosis Date Noted   Irregular bleeding 11/07/2020   History of hysteroscopy 11/07/2020   Pelvic pain 07/04/2020   Constipation 07/04/2020    Past Surgical History:  Procedure Laterality Date   HYSTEROSCOPY WITH D & C N/A 09/08/2020   Procedure: DILATATION AND CURETTAGE /HYSTEROSCOPY AND RESECTION OF ENDOMETRIAL MASS;  Surgeon: Jerene Bears, MD;  Location: [facility] [place];  Service: Gynecology;  Laterality: N/A;   wisdome teeth exaction     WRIST SURGERY Left 12 yrs ago   had pins later removed     OB History     Gravida  1   Para      Term      Preterm      AB  1   Living         SAB  1   IAB      Ectopic      Multiple      Live Births              Family History  Problem Relation Age of Onset   Diabetes Other    Hypertension Other     Social History   Tobacco Use   Smoking status: Former    Types: E-cigarettes   Smokeless tobacco: Never  Vaping Use   Vaping Use: Some days   Substances: Nicotine, CBD  Substance Use Topics   Alcohol use:  Yes    Comment: occ   Drug use: Not Currently    Home Medications Prior to Admission medications   Medication Sig Start Date End Date Taking? Authorizing Provider  linaclotide Karlene Einstein) 145 MCG CAPS capsule Take 1 capsule (145 mcg total) by mouth daily before breakfast. 11/01/20   Tressia Danas, MD    Allergies    Lactose intolerance (gi)  Review of Systems   Review of Systems  Constitutional:  Negative for chills and fever.  Eyes:  Positive for photophobia, pain and redness. Negative for discharge and itching.  Respiratory:  Negative for shortness of breath.   Cardiovascular:  Negative for chest pain.   Physical Exam Updated Vital Signs BP (!) 157/95   Pulse (!) 106   Temp 98.8 F (37.1 C) (Oral)   Resp 16   Ht 5\' 5"  (1.651 m)   Wt 90.7 kg   LMP 01/19/2021   SpO2 100%   BMI 33.28 kg/m   Physical Exam Vitals and nursing note reviewed. Exam conducted with a chaperone present.  Constitutional:  General: She is not in acute distress.    Appearance: Normal appearance.  HENT:     Head: Normocephalic and atraumatic.  Eyes:     General: No scleral icterus.       Right eye: Discharge present.     Extraocular Movements: Extraocular movements intact.     Pupils: Pupils are equal, round, and reactive to light.     Comments: Bilateral erythema to conjunctiva. EOMI unable to be checked due to pain.  No fluorescein dye, patient has 2 corneal abrasions noted to the inferior aspect of the conjunctivA.  Does not cover the pupil.  Cardiovascular:     Rate and Rhythm: Regular rhythm. Tachycardia present.  Musculoskeletal:     Comments: Self inflicted lacs to arms, no active bleeding. Appear old  Skin:    Coloration: Skin is not jaundiced.  Neurological:     Mental Status: She is alert and oriented to person, place, and time. Mental status is at baseline.     Coordination: Coordination normal.    ED Results / Procedures / Treatments   Labs (all labs ordered are listed,  but only abnormal results are displayed) Labs Reviewed - No data to display  EKG None  Radiology No results found.  Procedures Procedures   Medications Ordered in ED Medications - No data to display  ED Course  I have reviewed the triage vital signs and the nursing notes.  Pertinent labs & imaging results that were available during my care of the patient were reviewed by me and considered in my medical decision making (see chart for details).    MDM Rules/Calculators/A&P                           Patient was initially tachycardic on intake, that is improved with pain control.  Visual acuity unable to check due to patient noncompliance from pain.  She does state that her pain is improved significantly since arriving to the ED, but she does not wish to have a visual acuity test due to photophobia. Discussed limitations of workup, and engaged in shared decision  making to proceed without confirming visual acuity.   Physical exam findings are consistent with bilateral corneal abrasions.  No signs of ruptured globes. Pupil is spared, will prescribe ofloxacin given she occasionally uses contacts.  We will also give a short dose of narcotics for pain management.  If pain does not improve next week and patient has been advised to follow-up with ophthalmology.  Strict return precautions were given, patient verbalized understanding.  Discussed HPI, physical exam and plan of care for this patient with attending Cathren Laine. The attending physician evaluated this patient as part of a shared visit and agrees with plan of care.   Final Clinical Impression(s) / ED Diagnoses Final diagnoses:  None    Rx / DC Orders ED Discharge Orders     None        Theron Arista, Cordelia Poche 02/19/21 1809    Cathren Laine, MD 02/23/21 1520

## 2021-04-13 ENCOUNTER — Emergency Department (HOSPITAL_COMMUNITY): Payer: Medicaid Other

## 2021-04-13 ENCOUNTER — Emergency Department (HOSPITAL_COMMUNITY)
Admission: EM | Admit: 2021-04-13 | Discharge: 2021-04-14 | Disposition: A | Discharge status: Home / Self Care | Payer: Medicaid Other | Attending: Emergency Medicine | Admitting: Emergency Medicine

## 2021-04-13 ENCOUNTER — Encounter (HOSPITAL_COMMUNITY): Payer: Self-pay | Admitting: Emergency Medicine

## 2021-04-13 ENCOUNTER — Other Ambulatory Visit: Payer: Self-pay

## 2021-04-13 DIAGNOSIS — B9689 Other specified bacterial agents as the cause of diseases classified elsewhere: Secondary | ICD-10-CM

## 2021-04-13 DIAGNOSIS — N9489 Other specified conditions associated with female genital organs and menstrual cycle: Secondary | ICD-10-CM | POA: Insufficient documentation

## 2021-04-13 DIAGNOSIS — R112 Nausea with vomiting, unspecified: Secondary | ICD-10-CM | POA: Diagnosis not present

## 2021-04-13 DIAGNOSIS — R109 Unspecified abdominal pain: Secondary | ICD-10-CM | POA: Insufficient documentation

## 2021-04-13 DIAGNOSIS — R1030 Lower abdominal pain, unspecified: Secondary | ICD-10-CM

## 2021-04-13 DIAGNOSIS — Z87891 Personal history of nicotine dependence: Secondary | ICD-10-CM | POA: Diagnosis not present

## 2021-04-13 DIAGNOSIS — N760 Acute vaginitis: Secondary | ICD-10-CM

## 2021-04-13 DIAGNOSIS — Z8616 Personal history of COVID-19: Secondary | ICD-10-CM | POA: Insufficient documentation

## 2021-04-13 LAB — CBC WITH DIFFERENTIAL/PLATELET
Abs Immature Granulocytes: 0.02 10*3/uL (ref 0.00–0.07)
Basophils Absolute: 0 10*3/uL (ref 0.0–0.1)
Basophils Relative: 0 %
Eosinophils Absolute: 0.2 10*3/uL (ref 0.0–0.5)
Eosinophils Relative: 3 %
HCT: 36.6 % (ref 36.0–46.0)
Hemoglobin: 11.3 g/dL — ABNORMAL LOW (ref 12.0–15.0)
Immature Granulocytes: 0 %
Lymphocytes Relative: 38 %
Lymphs Abs: 2.4 10*3/uL (ref 0.7–4.0)
MCH: 24.4 pg — ABNORMAL LOW (ref 26.0–34.0)
MCHC: 30.9 g/dL (ref 30.0–36.0)
MCV: 79 fL — ABNORMAL LOW (ref 80.0–100.0)
Monocytes Absolute: 0.6 10*3/uL (ref 0.1–1.0)
Monocytes Relative: 9 %
Neutro Abs: 3.3 10*3/uL (ref 1.7–7.7)
Neutrophils Relative %: 50 %
Platelets: 337 10*3/uL (ref 150–400)
RBC: 4.63 MIL/uL (ref 3.87–5.11)
RDW: 16.9 % — ABNORMAL HIGH (ref 11.5–15.5)
WBC: 6.5 10*3/uL (ref 4.0–10.5)
nRBC: 0 % (ref 0.0–0.2)

## 2021-04-13 LAB — COMPREHENSIVE METABOLIC PANEL
ALT: 28 U/L (ref 0–44)
AST: 34 U/L (ref 15–41)
Albumin: 3.9 g/dL (ref 3.5–5.0)
Alkaline Phosphatase: 60 U/L (ref 38–126)
Anion gap: 8 (ref 5–15)
BUN: 6 mg/dL (ref 6–20)
CO2: 26 mmol/L (ref 22–32)
Calcium: 9.5 mg/dL (ref 8.9–10.3)
Chloride: 102 mmol/L (ref 98–111)
Creatinine, Ser: 0.84 mg/dL (ref 0.44–1.00)
GFR, Estimated: >60 mL/min (ref >60)
Glucose, Bld: 119 mg/dL — ABNORMAL HIGH (ref 70–99)
Potassium: 3.6 mmol/L (ref 3.5–5.1)
Sodium: 136 mmol/L (ref 135–145)
Total Bilirubin: 0.8 mg/dL (ref 0.3–1.2)
Total Protein: 7.2 g/dL (ref 6.5–8.1)

## 2021-04-13 LAB — URINALYSIS, ROUTINE W REFLEX MICROSCOPIC
Bacteria, UA: NONE SEEN
Bilirubin Urine: NEGATIVE
Glucose, UA: NEGATIVE mg/dL
Hgb urine dipstick: NEGATIVE
Ketones, ur: 5 mg/dL — AB
Nitrite: NEGATIVE
Protein, ur: 30 mg/dL — AB
Specific Gravity, Urine: 1.026 (ref 1.005–1.030)
pH: 6 (ref 5.0–8.0)

## 2021-04-13 LAB — I-STAT BETA HCG BLOOD, ED (MC, WL, AP ONLY): I-stat hCG, quantitative: <5 m[IU]/mL (ref <5)

## 2021-04-13 LAB — LIPASE, BLOOD: Lipase: 38 U/L (ref 11–51)

## 2021-04-13 MED ORDER — SODIUM CHLORIDE 0.9 % IV BOLUS
1000.0000 mL | Freq: Once | INTRAVENOUS | Status: AC
  Administered 2021-04-13: 1000 mL via INTRAVENOUS

## 2021-04-13 MED ORDER — ONDANSETRON 4 MG PO TBDP
8.0000 mg | ORAL_TABLET | Freq: Once | ORAL | Status: AC
  Administered 2021-04-13: 8 mg via ORAL
  Filled 2021-04-13: qty 2

## 2021-04-13 MED ORDER — KETOROLAC TROMETHAMINE 30 MG/ML IJ SOLN
30.0000 mg | Freq: Once | INTRAMUSCULAR | Status: AC
Start: 2021-04-13 — End: 2021-04-13
  Administered 2021-04-13: 30 mg via INTRAVENOUS
  Filled 2021-04-13: qty 1

## 2021-04-13 NOTE — ED Provider Notes (Signed)
Emergency Medicine Provider Triage Evaluation Note  Emily Yu , a 25 y.o. female  was evaluated in triage.  Pt complains of right flank and right lower quadrant pain x1 day.  Started acutely this morning, pain is constant.  Worsened by movement, denies any recent trauma or muscle exacerbation.  No dysuria or hematuria, no prior abdominal surgeries.  Reports she has had an irregular cycle.  No history of kidney stones.  Review of Systems  Positive: Right flank pain, nausea Negative: Dysuria, hematuria, vomiting, fevers  Physical Exam  BP (!) 141/98 (BP Location: Left Arm)   Pulse 82   Temp 99.1 F (37.3 C)   Resp 18   LMP 03/21/2021 (Approximate)   SpO2 100%  Gen:   Awake, no distress   Resp:  Normal effort  MSK:   Moves extremities without difficulty  Other:  Right lower quadrant tenderness, right CVA tenderness.  Medical Decision Making  Medically screening exam initiated at 6:16 PM.  Appropriate orders placed.  Emily Yu was informed that the remainder of the evaluation will be completed by another provider, this initial triage assessment does not replace that evaluation, and the importance of remaining in the ED until their evaluation is complete.  Flank pain work-up, no urinary symptoms will check urine.   Theron Arista, PA-C 04/13/21 1817    Milagros Loll, MD 04/14/21 0010

## 2021-04-13 NOTE — ED Notes (Signed)
ED Provider at bedside. 

## 2021-04-13 NOTE — ED Notes (Signed)
Pt back in room from CT 

## 2021-04-13 NOTE — ED Triage Notes (Signed)
Pt here from home for R side flank/lower back pain that radiates to R hip/groin. Pt reports nausea, and chills, denies dysuria, shob.  PT reports pain started this morning suddenly

## 2021-04-13 NOTE — ED Notes (Signed)
Patient transported to CT 

## 2021-04-13 NOTE — ED Notes (Signed)
Patient transported to Ultrasound 

## 2021-04-13 NOTE — ED Provider Notes (Signed)
[facility] EMERGENCY DEPARTMENT Provider Note   CSN: [id number] Arrival date & time: 04/13/21  1757     History Chief Complaint  Patient presents with   Flank Pain    PREETI WINEGARDNER is a 25 y.o. female.  With no significant past medical history presents emergency department with right flank pain.  States that she woke up at 3 AM this morning with constant, sharp, 10/10 right flank pain that she says radiates to her right groin.  She states that she was able to go back to sleep initially but throughout today it is progressively worsened.   She has associated nausea with an episode of vomiting earlier today, ongoing nausea now.  She states that any movement makes it worse, attempting to lay still makes it better.  She denies any recent traumas.  Denies hematuria, dysuria, abdominal surgeries.  Denies fever.   Flank Pain Associated symptoms include abdominal pain.  Past Medical History:  Diagnosis Date   Anemia    Concussion 12 yrs ago   no residual had temporary memory loss   COVID 07/26/2020   bulge on neck x 7 days all symptoms resolved   Endometrial mass     Patient Active Problem List   Diagnosis Date Noted   Irregular bleeding 11/07/2020   History of hysteroscopy 11/07/2020   Pelvic pain 07/04/2020   Constipation 07/04/2020    Past Surgical History:  Procedure Laterality Date   HYSTEROSCOPY WITH D & C N/A 09/08/2020   Procedure: DILATATION AND CURETTAGE /HYSTEROSCOPY AND RESECTION OF ENDOMETRIAL MASS;  Surgeon: Jerene Bears, MD;  Location: [facility] [place];  Service: Gynecology;  Laterality: N/A;   wisdome teeth exaction     WRIST SURGERY Left 12 yrs ago   had pins later removed     OB History     Gravida  1   Para      Term      Preterm      AB  1   Living         SAB  1   IAB      Ectopic      Multiple      Live Births              Family History  Problem Relation Age of Onset   Diabetes Other     Hypertension Other     Social History   Tobacco Use   Smoking status: Former    Types: E-cigarettes   Smokeless tobacco: Never  Vaping Use   Vaping Use: Some days   Substances: Nicotine, CBD  Substance Use Topics   Alcohol use: Yes    Comment: occ   Drug use: Not Currently    Home Medications Prior to Admission medications   Medication Sig Start Date End Date Taking? Authorizing Provider  linaclotide Karlene Einstein) 145 MCG CAPS capsule Take 1 capsule (145 mcg total) by mouth daily before breakfast. 02/19/21   Theron Arista, PA-C  ofloxacin (OCUFLOX) 0.3 % ophthalmic solution Place 1 drop into both eyes 4 (four) times daily. 02/19/21   Theron Arista, PA-C  oxyCODONE-acetaminophen (PERCOCET/ROXICET) 5-325 MG tablet Take 1 tablet by mouth every 6 (six) hours as needed for severe pain. 02/19/21   Theron Arista, PA-C    Allergies    Lactose intolerance (gi)  Review of Systems   Review of Systems  Constitutional:  Positive for chills. Negative for fever.  Gastrointestinal:  Positive for abdominal pain, nausea and  vomiting.  Genitourinary:  Positive for flank pain. Negative for dysuria, hematuria, vaginal bleeding and vaginal discharge.  All other systems reviewed and are negative.  Physical Exam Updated Vital Signs BP (!) 145/61   Pulse 74   Temp 98.3 F (36.8 C)   Resp 16   LMP 03/21/2021 (Approximate)   SpO2 100%   Physical Exam Vitals and nursing note reviewed.  Constitutional:      Appearance: Normal appearance. She is not toxic-appearing.     Comments: She is guarding her right flank and laying on her left side.  In obvious discomfort but not acute distress.  HENT:     Head: Normocephalic.  Eyes:     General: No scleral icterus.    Pupils: Pupils are equal, round, and reactive to light.  Cardiovascular:     Rate and Rhythm: Normal rate and regular rhythm.     Pulses: Normal pulses.     Heart sounds: No murmur heard. Pulmonary:     Effort: Pulmonary effort is normal. No  respiratory distress.     Breath sounds: Normal breath sounds.  Abdominal:     General: Bowel sounds are normal. There is no distension.     Palpations: Abdomen is soft.     Tenderness: There is abdominal tenderness. There is right CVA tenderness and guarding. There is no left CVA tenderness or rebound.  Musculoskeletal:     Cervical back: Normal range of motion.  Skin:    General: Skin is warm and dry.     Capillary Refill: Capillary refill takes less than 2 seconds.     Coloration: Skin is not jaundiced or pale.  Neurological:     General: No focal deficit present.     Mental Status: She is alert and oriented to person, place, and time. Mental status is at baseline.  Psychiatric:        Mood and Affect: Mood normal.        Behavior: Behavior normal.    ED Results / Procedures / Treatments   Labs (all labs ordered are listed, but only abnormal results are displayed) Labs Reviewed  CBC WITH DIFFERENTIAL/PLATELET - Abnormal; Notable for the following components:      Result Value   Hemoglobin 11.3 (*)    MCV 79.0 (*)    MCH 24.4 (*)    RDW 16.9 (*)    All other components within normal limits  COMPREHENSIVE METABOLIC PANEL - Abnormal; Notable for the following components:   Glucose, Bld 119 (*)    All other components within normal limits  URINALYSIS, ROUTINE W REFLEX MICROSCOPIC - Abnormal; Notable for the following components:   APPearance HAZY (*)    Ketones, ur 5 (*)    Protein, ur 30 (*)    Leukocytes,Ua MODERATE (*)    All other components within normal limits  LIPASE, BLOOD  I-STAT BETA HCG BLOOD, ED ([facility], [facility], [facility] ONLY)   EKG None  Radiology US Transvaginal Non-OB  Result Date: 04/13/2021 CLINICAL DATA:  Sudden onset abdominal pain. Last menstrual period 03/27/2021 EXAM: TRANSABDOMINAL AND TRANSVAGINAL ULTRASOUND OF PELVIS DOPPLER ULTRASOUND OF OVARIES TECHNIQUE: Both transabdominal and transvaginal ultrasound examinations of the pelvis were performed.  Transabdominal technique was performed for global imaging of the pelvis including uterus, ovaries, adnexal regions, and pelvic cul-de-sac. It was necessary to proceed with endovaginal exam following the transabdominal exam to visualize the left ovary. Color and duplex Doppler ultrasound was utilized to evaluate blood flow to the ovaries. COMPARISON:  CT  renal 04/13/2021 FINDINGS: Uterus Measurements: 7.7 x 4.9 x 5.4 cm = volume: 107 mL. No fibroids or other mass visualized. Endometrium Thickness: 16 mm.  No focal abnormality visualized. Right ovary Measurements: 3.8 x 2.3 x 2.9 cm = volume: 14 mL. Normal appearance/no adnexal mass. Left ovary Measurements: 2.9 x 1.3 x 3 cm = volume: 6 mL. Normal appearance/no adnexal mass. Pulsed Doppler evaluation of both ovaries demonstrates normal low-resistance arterial and venous waveforms. Other findings No abnormal free fluid. IMPRESSION: Endometrium measures at the upper limits of normal (16 mm). Correlate with menstruation cycle. Electronically Signed   By: Tish Frederickson M.D.   On: 04/13/2021 23:40   US Pelvis Complete  Result Date: 04/13/2021 CLINICAL DATA:  Sudden onset abdominal pain. Last menstrual period 03/27/2021 EXAM: TRANSABDOMINAL AND TRANSVAGINAL ULTRASOUND OF PELVIS DOPPLER ULTRASOUND OF OVARIES TECHNIQUE: Both transabdominal and transvaginal ultrasound examinations of the pelvis were performed. Transabdominal technique was performed for global imaging of the pelvis including uterus, ovaries, adnexal regions, and pelvic cul-de-sac. It was necessary to proceed with endovaginal exam following the transabdominal exam to visualize the left ovary. Color and duplex Doppler ultrasound was utilized to evaluate blood flow to the ovaries. COMPARISON:  CT renal 04/13/2021 FINDINGS: Uterus Measurements: 7.7 x 4.9 x 5.4 cm = volume: 107 mL. No fibroids or other mass visualized. Endometrium Thickness: 16 mm.  No focal abnormality visualized. Right ovary Measurements:  3.8 x 2.3 x 2.9 cm = volume: 14 mL. Normal appearance/no adnexal mass. Left ovary Measurements: 2.9 x 1.3 x 3 cm = volume: 6 mL. Normal appearance/no adnexal mass. Pulsed Doppler evaluation of both ovaries demonstrates normal low-resistance arterial and venous waveforms. Other findings No abnormal free fluid. IMPRESSION: Endometrium measures at the upper limits of normal (16 mm). Correlate with menstruation cycle. Electronically Signed   By: Tish Frederickson M.D.   On: 04/13/2021 23:40   [place] Art/Ven Flow Abd Pelv Doppler  Result Date: 04/13/2021 CLINICAL DATA:  Sudden onset abdominal pain. Last menstrual period 03/27/2021 EXAM: TRANSABDOMINAL AND TRANSVAGINAL ULTRASOUND OF PELVIS DOPPLER ULTRASOUND OF OVARIES TECHNIQUE: Both transabdominal and transvaginal ultrasound examinations of the pelvis were performed. Transabdominal technique was performed for global imaging of the pelvis including uterus, ovaries, adnexal regions, and pelvic cul-de-sac. It was necessary to proceed with endovaginal exam following the transabdominal exam to visualize the left ovary. Color and duplex Doppler ultrasound was utilized to evaluate blood flow to the ovaries. COMPARISON:  CT renal 04/13/2021 FINDINGS: Uterus Measurements: 7.7 x 4.9 x 5.4 cm = volume: 107 mL. No fibroids or other mass visualized. Endometrium Thickness: 16 mm.  No focal abnormality visualized. Right ovary Measurements: 3.8 x 2.3 x 2.9 cm = volume: 14 mL. Normal appearance/no adnexal mass. Left ovary Measurements: 2.9 x 1.3 x 3 cm = volume: 6 mL. Normal appearance/no adnexal mass. Pulsed Doppler evaluation of both ovaries demonstrates normal low-resistance arterial and venous waveforms. Other findings No abnormal free fluid. IMPRESSION: Endometrium measures at the upper limits of normal (16 mm). Correlate with menstruation cycle. Electronically Signed   By: Tish Frederickson M.D.   On: 04/13/2021 23:40   CT Renal Stone Study  Result Date: 04/13/2021 CLINICAL  DATA:  Flank pain EXAM: CT ABDOMEN AND PELVIS WITHOUT CONTRAST TECHNIQUE: Multidetector CT imaging of the abdomen and pelvis was performed following the standard protocol without IV contrast. COMPARISON:  12/25/2016 FINDINGS: Lower chest: No acute pleural or parenchymal lung disease. Hepatobiliary: No focal liver abnormality is seen. No gallstones, gallbladder wall thickening, or biliary dilatation. Pancreas:  Unremarkable. No pancreatic ductal dilatation or surrounding inflammatory changes. Spleen: Normal in size without focal abnormality. Adrenals/Urinary Tract: No urinary tract calculi or obstructive uropathy within either kidney. The adrenals and bladder are unremarkable. Stomach/Bowel: No bowel obstruction or ileus. Normal appendix central pelvis. No bowel wall thickening or inflammatory change. Vascular/Lymphatic: No significant vascular findings are present. No enlarged abdominal or pelvic lymph nodes. Reproductive: Uterus and bilateral adnexa are unremarkable. Other: No free fluid or free intraperitoneal gas. No abdominal wall hernia. Musculoskeletal: No acute or destructive bony lesions. Reconstructed images demonstrate no additional findings. IMPRESSION: 1. No urinary tract calculi or obstructive uropathy. 2. Unremarkable unenhanced CT of the abdomen and pelvis. Electronically Signed   By: Sharlet Salina M.D.   On: 04/13/2021 22:26    Procedures Procedures   Medications Ordered in ED Medications  ondansetron (ZOFRAN-ODT) disintegrating tablet 8 mg (8 mg Oral Given 04/13/21 1820)  sodium chloride 0.9 % bolus 1,000 mL (1,000 mLs Intravenous New Bag/Given 04/13/21 2145)  ketorolac (TORADOL) 30 MG/ML injection 30 mg (30 mg Intravenous Given 04/13/21 2145)  morphine 2 MG/ML injection 2 mg (2 mg Intravenous Given 04/14/21 0017)    ED Course  I have reviewed the triage vital signs and the nursing notes.  Pertinent labs & imaging results that were available during my care of the patient were  reviewed by me and considered in my medical decision making (see chart for details).  Moderate improvement of pain with Toradol.  She is still having abdominal pain in the right lower quadrant with palpation.  Appendix is normal on CT.  We will get TVUS to rule out ovarian torsion. MDM Rules/Calculators/A&P 25 year old female who presents emergency department with right-sided flank and abdominal pain. She does not have any peritoneal signs.  There is no evidence of an acute abdomen.  She is exquisitely tender and guarding in the bed on my initial exam. Presentation was initially consistent with a kidney stone.  Given Toradol, IV fluids, Zofran with some relief of symptoms.  CT stone study negative for acute renal stone that could be the nidus of her pain. Not pregnant Given that she had sudden onset pain this morning we will rule out torsion. Pelvic ultrasound without ovarian torsion. Patient has had no complaints of vaginal discharge or bleeding.  She has no concerns about STDs so doubt PID or TOA. I have low suspicion for any hepatobiliary disease including acute cholecystitis.  Her labs have been unremarkable.  Lipase negative so doubt pancreatitis.  CT stone study showed normal appendix. At time of handoff the patient is still having abdominal pain.  I have redosed her with morphine.  She is being handed off to [facility] [employer].  Plan at this time is pain control and then disposition.  Final Clinical Impression(s) / ED Diagnoses Final diagnoses:  None    Rx / DC Orders ED Discharge Orders     None        Cristopher Peru, PA-C 04/14/21 0036    Gerhard Munch, MD 04/14/21 2311

## 2021-04-13 NOTE — ED Notes (Signed)
Pt back from [facility]/S. Family at bedside. No acute changes noted. Will continue to monitor.

## 2021-04-14 LAB — WET PREP, GENITAL
Sperm: NONE SEEN
Trich, Wet Prep: NONE SEEN
Yeast Wet Prep HPF POC: NONE SEEN

## 2021-04-14 MED ORDER — MORPHINE SULFATE (PF) 4 MG/ML IV SOLN
4.0000 mg | Freq: Once | INTRAVENOUS | Status: AC
  Administered 2021-04-14: 4 mg via INTRAVENOUS
  Filled 2021-04-14: qty 1

## 2021-04-14 MED ORDER — IBUPROFEN 600 MG PO TABS: 600.0000 mg | ORAL_TABLET | Freq: Four times a day (QID) | ORAL | 0 refills | Status: DC | PRN

## 2021-04-14 MED ORDER — HYDROCODONE-ACETAMINOPHEN 5-325 MG PO TABS: 1.0000 | ORAL_TABLET | ORAL | 0 refills | Status: DC | PRN

## 2021-04-14 MED ORDER — MORPHINE SULFATE (PF) 2 MG/ML IV SOLN
2.0000 mg | Freq: Once | INTRAVENOUS | Status: AC
  Administered 2021-04-14: 2 mg via INTRAVENOUS
  Filled 2021-04-14: qty 1

## 2021-04-14 NOTE — ED Provider Notes (Signed)
Sudden onset right flank to abdomen pain that woke her overnight No hematuria, urinary symptoms, no h/o stones Nausea without vomiting No fever  CT renal negative Medications with some improvement Pelvic US negative - torsion  Plan: reassess pain, provide follow up outpatient  1:00 - introduction to the patient She just received the previously ordered 2 mg morphine and reports no significant improvement. She states her pain is now going across to the left lower abdomen, so is expanding. She states light touch and even her clothing make the right flank and side pain worse. She reports history of constipation but with dissimilar symptoms altogether. Last BM yesterday.   Labs reviewed and are negative. Imaging reviewed (CT renal and Pelvic US shows only upper limits of normal endometrium.   No skin changes or rash of painful area.   Will perform pelvic exam.   Pelvic exam: there is a uniform, white vaginal discharge. No CMT, adnexal tenderness or mass. No evidence for PID or pelvic infection.   Wet prep show BV. Doubt this is the cause of patient's pain. Will provide Rx Flagyl. D/ch home. Recommend f/u with PCP or GYN if pain persists. Return precautions discussed.    Elpidio Anis, PA-C 04/14/21 2244    Gerhard Munch, MD 04/14/21 [phone]

## 2021-04-14 NOTE — Discharge Instructions (Addendum)
No cause was found to explain your pain. You are safe being discharged home. Recommend follow up with gynecology for further evaluation if pain continues.   If you develop a fever, have severe/worsening pain, new symptoms of concern, return to the ED for further evaluation.

## 2021-04-14 NOTE — ED Notes (Signed)
ED Provider at bedside. 

## 2021-04-15 LAB — GC/CHLAMYDIA PROBE AMP (~~LOC~~) NOT AT ARMC
Chlamydia: NEGATIVE
Comment: NEGATIVE
Comment: NORMAL
Neisseria Gonorrhea: NEGATIVE

## 2021-04-27 ENCOUNTER — Encounter (HOSPITAL_BASED_OUTPATIENT_CLINIC_OR_DEPARTMENT_OTHER): Payer: Self-pay | Admitting: *Deleted

## 2021-04-27 ENCOUNTER — Other Ambulatory Visit: Payer: Self-pay

## 2021-04-27 ENCOUNTER — Emergency Department (HOSPITAL_BASED_OUTPATIENT_CLINIC_OR_DEPARTMENT_OTHER)
Admission: EM | Admit: 2021-04-27 | Discharge: 2021-04-28 | Disposition: A | Discharge status: Home / Self Care | Payer: Medicaid Other | Attending: Emergency Medicine | Admitting: Emergency Medicine

## 2021-04-27 DIAGNOSIS — J069 Acute upper respiratory infection, unspecified: Secondary | ICD-10-CM | POA: Diagnosis not present

## 2021-04-27 DIAGNOSIS — Z20822 Contact with and (suspected) exposure to covid-19: Secondary | ICD-10-CM | POA: Diagnosis not present

## 2021-04-27 DIAGNOSIS — R509 Fever, unspecified: Secondary | ICD-10-CM | POA: Diagnosis present

## 2021-04-27 LAB — RESP PANEL BY RT-PCR (FLU A&B, COVID) ARPGX2
Influenza A by PCR: NEGATIVE
Influenza B by PCR: NEGATIVE
SARS Coronavirus 2 by RT PCR: NEGATIVE

## 2021-04-27 NOTE — ED Triage Notes (Signed)
C/o fever , cough , body aches x 3 days
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
## 2021-07-12 NOTE — Telephone Encounter (Signed)
Med change

## 2022-04-03 ENCOUNTER — Emergency Department (HOSPITAL_BASED_OUTPATIENT_CLINIC_OR_DEPARTMENT_OTHER)
Admission: EM | Admit: 2022-04-03 | Discharge: 2022-04-04 | Disposition: A | Discharge status: Home / Self Care | Payer: Medicaid Other | Attending: Emergency Medicine | Admitting: Emergency Medicine

## 2022-04-03 ENCOUNTER — Encounter (HOSPITAL_BASED_OUTPATIENT_CLINIC_OR_DEPARTMENT_OTHER): Payer: Self-pay

## 2022-04-03 ENCOUNTER — Other Ambulatory Visit: Payer: Self-pay

## 2022-04-03 DIAGNOSIS — N3000 Acute cystitis without hematuria: Secondary | ICD-10-CM | POA: Diagnosis not present

## 2022-04-03 DIAGNOSIS — B9689 Other specified bacterial agents as the cause of diseases classified elsewhere: Secondary | ICD-10-CM | POA: Diagnosis not present

## 2022-04-03 DIAGNOSIS — N760 Acute vaginitis: Secondary | ICD-10-CM | POA: Diagnosis not present

## 2022-04-03 DIAGNOSIS — R300 Dysuria: Secondary | ICD-10-CM | POA: Diagnosis present

## 2022-04-03 LAB — URINALYSIS, ROUTINE W REFLEX MICROSCOPIC
Bilirubin Urine: NEGATIVE
Glucose, UA: NEGATIVE mg/dL
Ketones, ur: NEGATIVE mg/dL
Nitrite: NEGATIVE
RBC / HPF: >50 RBC/hpf — ABNORMAL HIGH (ref 0–5)
Specific Gravity, Urine: 1.01 (ref 1.005–1.030)
Squamous Epithelial / HPF: >50 — ABNORMAL HIGH (ref 0–5)
WBC, UA: >50 WBC/hpf — ABNORMAL HIGH (ref 0–5)
pH: 6.5 (ref 5.0–8.0)

## 2022-04-03 LAB — WET PREP, GENITAL
Sperm: NONE SEEN
Trich, Wet Prep: NONE SEEN
WBC, Wet Prep HPF POC: <10 (ref <10)
Yeast Wet Prep HPF POC: NONE SEEN

## 2022-04-03 LAB — PREGNANCY, URINE: Preg Test, Ur: NEGATIVE

## 2022-04-03 NOTE — ED Triage Notes (Signed)
Pt reports vaginal itching and burning +discharge

## 2022-04-03 NOTE — ED Provider Notes (Signed)
[facility]  Provider Note  CSN: [id number] Arrival date & time: 04/03/22 2002  History Chief Complaint  Patient presents with   Vaginitis    Emily Yu is a 26 y.o. female reports about a week of vaginal irritation, discharge and dysuria with some blood in her urine. She is not currently sexually active, last unprotected sex was over a month ago. LMP was about 2 weeks ago. No prior history of vaginal infections.    Home Medications Prior to Admission medications   Medication Sig Start Date End Date Taking? Authorizing Provider  cephALEXin (KEFLEX) 500 MG capsule Take 1 capsule (500 mg total) by mouth 2 (two) times daily for 7 days. 04/04/22 04/11/22 Yes Truddie Hidden, MD  metroNIDAZOLE (FLAGYL) 500 MG tablet Take 1 tablet (500 mg total) by mouth 2 (two) times daily. 04/04/22  Yes Truddie Hidden, MD  HYDROcodone-acetaminophen (NORCO/VICODIN) 5-325 MG tablet Take 1 tablet by mouth every 4 (four) hours as needed for severe pain. 04/14/21   Charlann Lange, PA-C  ibuprofen (ADVIL) 600 MG tablet Take 1 tablet (600 mg total) by mouth every 6 (six) hours as needed. 04/14/21   Charlann Lange, PA-C  linaclotide (LINZESS) 145 MCG CAPS capsule Take 1 capsule (145 mcg total) by mouth daily before breakfast. Patient taking differently: Take 145 mcg by mouth daily as needed (constipation). 02/19/21   Sherrill Raring, PA-C     Allergies    Lactose intolerance (gi)   Review of Systems   Review of Systems Please see HPI for pertinent positives and negatives  Physical Exam BP (!) 140/74 (BP Location: Right Arm)   Pulse 89   Temp 98.8 F (37.1 C) (Oral)   Resp 18   Ht 5\' 5"  (1.651 m)   Wt 88 kg   LMP 03/16/2022 (Exact Date)   SpO2 99%   BMI 32.28 kg/m   Physical Exam Vitals and nursing note reviewed.  HENT:     Head: Normocephalic.     Nose: Nose normal.  Eyes:     Extraocular Movements: Extraocular movements intact.  Pulmonary:     Effort:  Pulmonary effort is normal.  Genitourinary:    Comments: Chaperone present. External genitalia is normal no rash or lesions. There is yellow mucousy discharge. Cervix is not friable.  Musculoskeletal:        General: Normal range of motion.     Cervical back: Neck supple.  Skin:    Findings: No rash (on exposed skin).  Neurological:     Mental Status: She is alert and oriented to person, place, and time.  Psychiatric:        Mood and Affect: Mood normal.     ED Results / Procedures / Treatments   EKG None  Procedures Procedures  Medications Ordered in the ED Medications - No data to display  Initial Impression and Plan  Patient with vaginal discharged and dysuria. Pelvic swabs done and pending. UA is concerning for UTI. HCG is neg.   ED Course   Clinical Course as of 04/04/22 0103  Tue Apr 04, 2022  0100 Wet prep with clue cells. Will treat for BV. Patient not concerned about GC/CT and wants to defer treatment pending that result. Rx for Keflex for UTI.  [CS]    Clinical Course User Index [CS] Truddie Hidden, MD     MDM Rules/Calculators/A&P Medical Decision Making Problems Addressed: Acute cystitis without hematuria: acute illness or injury BV (bacterial vaginosis): acute illness or  injury  Amount and/or Complexity of Data Reviewed Labs: ordered. Decision-making details documented in ED Course.  Risk Prescription drug management.    Final Clinical Impression(s) / ED Diagnoses Final diagnoses:  Acute cystitis without hematuria  BV (bacterial vaginosis)    Rx / DC Orders ED Discharge Orders          Ordered    cephALEXin (KEFLEX) 500 MG capsule  2 times daily        04/04/22 0102    metroNIDAZOLE (FLAGYL) 500 MG tablet  2 times daily        04/04/22 0102             Pollyann Savoy, MD 04/04/22 [phone]

## 2022-04-04 MED ORDER — CEPHALEXIN 500 MG PO CAPS: 500.0000 mg | ORAL_CAPSULE | Freq: Two times a day (BID) | ORAL | 0 refills | Status: AC

## 2022-04-04 MED ORDER — METRONIDAZOLE 500 MG PO TABS: 500.0000 mg | ORAL_TABLET | Freq: Two times a day (BID) | ORAL | 0 refills | Status: DC

## 2022-04-05 LAB — GC/CHLAMYDIA PROBE AMP (~~LOC~~) NOT AT ARMC
Chlamydia: NEGATIVE
Comment: NEGATIVE
Comment: NORMAL
Neisseria Gonorrhea: NEGATIVE

## 2022-08-24 ENCOUNTER — Ambulatory Visit (HOSPITAL_COMMUNITY)
Admission: EM | Admit: 2022-08-24 | Discharge: 2022-08-24 | Disposition: A | Discharge status: Home / Self Care | Payer: Medicaid Other | Attending: Family Medicine | Admitting: Family Medicine

## 2022-08-24 ENCOUNTER — Encounter (HOSPITAL_COMMUNITY): Payer: Self-pay | Admitting: Emergency Medicine

## 2022-08-24 ENCOUNTER — Ambulatory Visit (INDEPENDENT_AMBULATORY_CARE_PROVIDER_SITE_OTHER): Payer: Medicaid Other

## 2022-08-24 ENCOUNTER — Other Ambulatory Visit: Payer: Self-pay

## 2022-08-24 DIAGNOSIS — R519 Headache, unspecified: Secondary | ICD-10-CM | POA: Insufficient documentation

## 2022-08-24 DIAGNOSIS — R051 Acute cough: Secondary | ICD-10-CM | POA: Diagnosis present

## 2022-08-24 DIAGNOSIS — R0600 Dyspnea, unspecified: Secondary | ICD-10-CM

## 2022-08-24 DIAGNOSIS — R079 Chest pain, unspecified: Secondary | ICD-10-CM

## 2022-08-24 DIAGNOSIS — Z20822 Contact with and (suspected) exposure to covid-19: Secondary | ICD-10-CM | POA: Diagnosis present

## 2022-08-24 DIAGNOSIS — R0789 Other chest pain: Secondary | ICD-10-CM | POA: Diagnosis present

## 2022-08-24 DIAGNOSIS — R059 Cough, unspecified: Secondary | ICD-10-CM

## 2022-08-24 LAB — SARS CORONAVIRUS 2 (TAT 6-24 HRS): SARS Coronavirus 2: NEGATIVE

## 2022-08-24 MED ORDER — PROMETHAZINE-DM 6.25-15 MG/5ML PO SYRP: 5.0000 mL | ORAL_SOLUTION | Freq: Four times a day (QID) | ORAL | 0 refills | Status: DC | PRN

## 2022-08-24 MED ORDER — ALBUTEROL SULFATE HFA 108 (90 BASE) MCG/ACT IN AERS: 2.0000 | INHALATION_SPRAY | Freq: Four times a day (QID) | RESPIRATORY_TRACT | 0 refills | Status: AC | PRN

## 2022-08-24 MED ORDER — PREDNISONE 20 MG PO TABS: 40.0000 mg | ORAL_TABLET | Freq: Every day | ORAL | 0 refills | Status: AC

## 2022-08-24 NOTE — ED Triage Notes (Addendum)
Reports symptoms started 4 days ago.  Symptoms have gradually worsened since onset.  Complains of general aches, hoarseness, sob, soreness lower ribcage on left, and same level hurts posteriorly in  back.  Complains of hot and cold chills.  Patient has not checked temperature  Home covid test yesterday was negative  Has taken mucinex
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
## 2022-08-24 NOTE — Discharge Instructions (Signed)
Recommend start Prednisone 60m (2 tablets) daily with food for 5 days. May use Albuterol inhaler 2 puffs every 6 hours as needed for shortness of breath or cough. May take Promethazine DM cough medication every 6 hours as needed. May also take OTC Tylenol 10018mevery 8 hours as needed for headache. Do not take Ibuprofen or Advil while on Prednisone. Continue to push fluids to help loosen up mucus in chest. Rest. Follow-up in 3 to 4 days if not improving or sooner if worsening.

## 2022-11-08 ENCOUNTER — Emergency Department (HOSPITAL_COMMUNITY): Payer: Medicaid Other

## 2022-11-08 ENCOUNTER — Emergency Department (HOSPITAL_COMMUNITY)
Admission: EM | Admit: 2022-11-08 | Discharge: 2022-11-08 | Disposition: A | Discharge status: Home / Self Care | Payer: Medicaid Other | Attending: Emergency Medicine | Admitting: Emergency Medicine

## 2022-11-08 DIAGNOSIS — S80211A Abrasion, right knee, initial encounter: Secondary | ICD-10-CM | POA: Insufficient documentation

## 2022-11-08 DIAGNOSIS — S99912A Unspecified injury of left ankle, initial encounter: Secondary | ICD-10-CM | POA: Insufficient documentation

## 2022-11-08 DIAGNOSIS — S99911A Unspecified injury of right ankle, initial encounter: Secondary | ICD-10-CM | POA: Diagnosis not present

## 2022-11-08 DIAGNOSIS — S80212A Abrasion, left knee, initial encounter: Secondary | ICD-10-CM | POA: Insufficient documentation

## 2022-11-08 DIAGNOSIS — S99919A Unspecified injury of unspecified ankle, initial encounter: Secondary | ICD-10-CM

## 2022-11-08 DIAGNOSIS — M25562 Pain in left knee: Secondary | ICD-10-CM | POA: Diagnosis present

## 2022-11-08 MED ORDER — BACITRACIN ZINC 500 UNIT/GM EX OINT
TOPICAL_OINTMENT | Freq: Two times a day (BID) | CUTANEOUS | Status: DC
  Filled 2022-11-08: qty 0.9

## 2022-11-08 NOTE — Discharge Instructions (Addendum)
Use crutches. Motrin and Tylenol as needed as directed. Apply ice for 20 minutes at a time. Follow up with orthopedics or see your PCP if not improving.
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
## 2023-07-04 NOTE — L&D Delivery Note (Signed)
 OB/GYN Faculty Practice Delivery Note  DAVINIA RICCARDI is a 28 y.o. G2P1011 s/p SVD at [redacted]w[redacted]d. She was admitted for IOL for cHTN.   ROM: 2h 58m with clear fluid GBS Status: negative Maximum Maternal Temperature: 98.1 F  Labor Progress: Augmentation with dual cytotec  and AROM, progressed to fully/+2 prior to receiving epidural  Delivery Date/Time: 03/04/2024 1354 Delivery: Called to room and patient was complete and pushing. Head delivered LOA. No nuchal cord present. Shoulder and body delivered in usual fashion. Infant with spontaneous cry, placed on mother's abdomen, dried and stimulated. Cord clamped x 2 after 7-minute delay per patient request, and cut by maternal grandmother. Cord blood drawn. Placenta delivered spontaneously, intact, with 3-vessel cord. Fundus firm with massage and Pitocin . Labia, perineum, vagina, and cervix inspected, 2nd degree perineal laceration found and repaired with 3-0 vicryl.   Placenta: intact Complications: none Lacerations: 2nd degree perineal, repaired EBL: Analgesia: none  Postpartum Planning [x]  transfer orders to MB [x]  discharge summary started & shared [x]  message to sent to schedule follow-up  [x]  lists updated  Infant: viable female  APGARs 9, 9  2610 g  Charlie DELENA Courts, MD 03/04/2024, 3:23 PM

## 2023-08-15 ENCOUNTER — Other Ambulatory Visit (INDEPENDENT_AMBULATORY_CARE_PROVIDER_SITE_OTHER): Payer: Medicaid Other

## 2023-08-15 ENCOUNTER — Encounter: Payer: Self-pay | Admitting: Obstetrics

## 2023-08-15 ENCOUNTER — Ambulatory Visit (INDEPENDENT_AMBULATORY_CARE_PROVIDER_SITE_OTHER): Payer: Medicaid Other | Admitting: *Deleted

## 2023-08-15 VITALS — BP 139/77 | HR 80 | Wt 196.4 lb

## 2023-08-15 DIAGNOSIS — Z3481 Encounter for supervision of other normal pregnancy, first trimester: Secondary | ICD-10-CM | POA: Diagnosis not present

## 2023-08-15 DIAGNOSIS — Z3480 Encounter for supervision of other normal pregnancy, unspecified trimester: Secondary | ICD-10-CM | POA: Insufficient documentation

## 2023-08-15 DIAGNOSIS — Z3A08 8 weeks gestation of pregnancy: Secondary | ICD-10-CM | POA: Diagnosis not present

## 2023-08-15 DIAGNOSIS — Z1339 Encounter for screening examination for other mental health and behavioral disorders: Secondary | ICD-10-CM

## 2023-08-15 DIAGNOSIS — O3680X0 Pregnancy with inconclusive fetal viability, not applicable or unspecified: Secondary | ICD-10-CM

## 2023-08-15 MED ORDER — BLOOD PRESSURE KIT DEVI
1.0000 | 0 refills | Status: AC
Start: 2023-08-15 — End: ?

## 2023-08-15 MED ORDER — VITAFOL GUMMIES 3.33-0.333-34.8 MG PO CHEW
3.0000 | CHEWABLE_TABLET | Freq: Every day | ORAL | 11 refills | Status: DC
Start: 2023-08-15 — End: 2024-03-06

## 2023-08-15 NOTE — Progress Notes (Signed)
New OB Intake  I connected with Emily Yu  on 08/15/23 at  9:15 AM EST by In Person Visit and verified that I am speaking with the correct person using two identifiers. Nurse is located at [facility] and pt is located at [place].  I discussed the limitations, risks, security and privacy concerns of performing an evaluation and management service by telephone and the availability of in person appointments. I also discussed with the patient that there may be a patient responsible charge related to this service. The patient expressed understanding and agreed to proceed.  I explained I am completing New OB Intake today. We discussed EDD of Not found.. Pt is G1P0010. I reviewed her allergies, medications and Medical/Surgical/OB history.    Patient Active Problem List   Diagnosis Date Noted   Irregular bleeding 11/07/2020   History of hysteroscopy 11/07/2020   Pelvic pain 07/04/2020   Constipation 07/04/2020    Concerns addressed today  Delivery Plans Plans to deliver at [facility]. Discussed the nature of our practice with multiple providers including residents and students. Due to the size of the practice, the delivering provider may not be the same as those providing prenatal care.   Patient  undecided  interested in water birth. Offered upcoming OB visit with CNM to discuss further.  MyChart/Babyscripts MyChart access verified. I explained pt will have some visits in office and some virtually. Babyscripts instructions given and order placed. Patient verifies receipt of registration text/e-mail. Account successfully created and app downloaded. If patient is a candidate for Optimized scheduling, add to sticky note.   Blood Pressure Cuff/Weight Scale Blood pressure cuff ordered for patient to pick-up from [employer]. Explained after first prenatal appt pt will check weekly and document in Babyscripts. Patient does not have weight scale; patient may purchase if they desire to track weight  weekly in Babyscripts.  Anatomy US Explained first scheduled [place] will be around 19 weeks. Anatomy US scheduled for TBD at TBD.  Interested in [place]? If yes, send referral and doula dot phrase.   Is patient a candidate for Babyscripts Optimization? No, due to Risk Factors   First visit review I reviewed new OB appt with patient. Explained pt will be seen by Dr. Marice Potter at first visit. Discussed Avelina Laine genetic screening with patient. Requests Panorama and Horizon.. Routine prenatal labs  OB Urine only collected at today's visit. Initial OB labs deferred to New OB appt.    Last Pap Diagnosis  Date Value Ref Range Status  06/28/2020   Final   - Negative for intraepithelial lesion or malignancy (NILM)    Harrel Lemon, RN 08/15/2023  9:36 AM

## 2023-08-15 NOTE — Patient Instructions (Signed)

## 2023-08-17 LAB — URINE CULTURE, OB REFLEX

## 2023-08-17 LAB — CULTURE, OB URINE

## 2023-08-28 ENCOUNTER — Other Ambulatory Visit (HOSPITAL_COMMUNITY)
Admission: RE | Admit: 2023-08-28 | Discharge: 2023-08-28 | Disposition: A | Discharge status: Home / Self Care | Payer: Medicaid Other | Source: Ambulatory Visit | Attending: Obstetrics & Gynecology | Admitting: Obstetrics & Gynecology

## 2023-08-28 ENCOUNTER — Ambulatory Visit: Payer: Medicaid Other | Admitting: Obstetrics & Gynecology

## 2023-08-28 VITALS — BP 147/84 | HR 116 | Wt 194.1 lb

## 2023-08-28 DIAGNOSIS — Z3A10 10 weeks gestation of pregnancy: Secondary | ICD-10-CM

## 2023-08-28 DIAGNOSIS — Z124 Encounter for screening for malignant neoplasm of cervix: Secondary | ICD-10-CM | POA: Diagnosis not present

## 2023-08-28 DIAGNOSIS — O161 Unspecified maternal hypertension, first trimester: Secondary | ICD-10-CM

## 2023-08-28 DIAGNOSIS — O99211 Obesity complicating pregnancy, first trimester: Secondary | ICD-10-CM | POA: Diagnosis not present

## 2023-08-28 DIAGNOSIS — Z3480 Encounter for supervision of other normal pregnancy, unspecified trimester: Secondary | ICD-10-CM | POA: Insufficient documentation

## 2023-08-28 DIAGNOSIS — Z3491 Encounter for supervision of normal pregnancy, unspecified, first trimester: Secondary | ICD-10-CM

## 2023-08-28 DIAGNOSIS — O99210 Obesity complicating pregnancy, unspecified trimester: Secondary | ICD-10-CM

## 2023-08-28 DIAGNOSIS — O169 Unspecified maternal hypertension, unspecified trimester: Secondary | ICD-10-CM | POA: Insufficient documentation

## 2023-08-28 MED ORDER — NIFEDIPINE ER OSMOTIC RELEASE 30 MG PO TB24: 30.0000 mg | ORAL_TABLET | Freq: Every day | ORAL | 3 refills | Status: DC

## 2023-08-28 MED ORDER — ASPIRIN 81 MG PO CHEW: 81.0000 mg | CHEWABLE_TABLET | Freq: Every day | ORAL | 3 refills | Status: DC

## 2023-08-28 NOTE — Progress Notes (Signed)
 Pt presents for NOB. No concerns. Pt reports slight pressure.
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
## 2023-08-29 LAB — PROTEIN / CREATININE RATIO, URINE
Creatinine, Urine: 399.1 mg/dL
Protein, Ur: 32.8 mg/dL
Protein/Creat Ratio: 82 mg/g{creat} (ref 0–200)

## 2023-08-29 LAB — CBC/D/PLT+RPR+RH+ABO+RUBIGG...
Antibody Screen: NEGATIVE
Basophils Absolute: 0 10*3/uL (ref 0.0–0.2)
Basos: 0 %
EOS (ABSOLUTE): 0.5 10*3/uL — ABNORMAL HIGH (ref 0.0–0.4)
Eos: 6 %
HCV Ab: NONREACTIVE
HIV Screen 4th Generation wRfx: NONREACTIVE
Hematocrit: 38.8 % (ref 34.0–46.6)
Hemoglobin: 11.8 g/dL (ref 11.1–15.9)
Hepatitis B Surface Ag: NEGATIVE
Immature Grans (Abs): 0 10*3/uL (ref 0.0–0.1)
Immature Granulocytes: 0 %
Lymphocytes Absolute: 2.4 10*3/uL (ref 0.7–3.1)
Lymphs: 32 %
MCH: 23.8 pg — ABNORMAL LOW (ref 26.6–33.0)
MCHC: 30.4 g/dL — ABNORMAL LOW (ref 31.5–35.7)
MCV: 78 fL — ABNORMAL LOW (ref 79–97)
Monocytes Absolute: 0.8 10*3/uL (ref 0.1–0.9)
Monocytes: 10 %
Neutrophils Absolute: 3.9 10*3/uL (ref 1.4–7.0)
Neutrophils: 52 %
Platelets: 309 10*3/uL (ref 150–450)
RBC: 4.95 x10E6/uL (ref 3.77–5.28)
RDW: 22.2 % — ABNORMAL HIGH (ref 11.7–15.4)
RPR Ser Ql: NONREACTIVE
Rh Factor: POSITIVE
Rubella Antibodies, IGG: 5.58 {index} (ref >0.99)
WBC: 7.7 10*3/uL (ref 3.4–10.8)

## 2023-08-29 LAB — COMPREHENSIVE METABOLIC PANEL
ALT: 14 IU/L (ref 0–32)
AST: 20 IU/L (ref 0–40)
Albumin: 4.3 g/dL (ref 4.0–5.0)
Alkaline Phosphatase: 54 IU/L (ref 44–121)
BUN/Creatinine Ratio: 8 — ABNORMAL LOW (ref 9–23)
BUN: 5 mg/dL — ABNORMAL LOW (ref 6–20)
Bilirubin Total: <0.2 mg/dL (ref 0.0–1.2)
CO2: 18 mmol/L — ABNORMAL LOW (ref 20–29)
Calcium: 9.6 mg/dL (ref 8.7–10.2)
Chloride: 103 mmol/L (ref 96–106)
Creatinine, Ser: 0.63 mg/dL (ref 0.57–1.00)
Globulin, Total: 2.8 g/dL (ref 1.5–4.5)
Glucose: 90 mg/dL (ref 70–99)
Potassium: 4 mmol/L (ref 3.5–5.2)
Sodium: 137 mmol/L (ref 134–144)
Total Protein: 7.1 g/dL (ref 6.0–8.5)
eGFR: 125 mL/min/{1.73_m2} (ref >59)

## 2023-08-29 LAB — HEMOGLOBIN A1C
Est. average glucose Bld gHb Est-mCnc: 120 mg/dL
Hgb A1c MFr Bld: 5.8 % — ABNORMAL HIGH (ref 4.8–5.6)

## 2023-08-29 LAB — HCV INTERPRETATION

## 2023-08-29 LAB — TSH+FREE T4
Free T4: 1.04 ng/dL (ref 0.82–1.77)
TSH: 6.25 u[IU]/mL — ABNORMAL HIGH (ref 0.450–4.500)

## 2023-08-29 NOTE — Addendum Note (Signed)
 Addended by: Maretta Bees on: 08/29/2023 03:33 PM   Modules accepted: Orders

## 2023-08-30 LAB — CYTOLOGY - PAP
Chlamydia: NEGATIVE
Comment: NEGATIVE
Comment: NEGATIVE
Comment: NORMAL
Diagnosis: NEGATIVE
Neisseria Gonorrhea: NEGATIVE
Trichomonas: NEGATIVE

## 2023-09-03 ENCOUNTER — Encounter: Payer: Self-pay | Admitting: Obstetrics & Gynecology

## 2023-09-03 LAB — PANORAMA PRENATAL TEST FULL PANEL:PANORAMA TEST PLUS 5 ADDITIONAL MICRODELETIONS: FETAL FRACTION: 5.3

## 2023-09-04 ENCOUNTER — Ambulatory Visit (INDEPENDENT_AMBULATORY_CARE_PROVIDER_SITE_OTHER): Payer: Medicaid Other | Admitting: Obstetrics & Gynecology

## 2023-09-04 ENCOUNTER — Encounter: Payer: Self-pay | Admitting: Obstetrics & Gynecology

## 2023-09-04 VITALS — BP 128/72 | HR 80 | Wt 198.0 lb

## 2023-09-04 DIAGNOSIS — Z3480 Encounter for supervision of other normal pregnancy, unspecified trimester: Secondary | ICD-10-CM | POA: Diagnosis not present

## 2023-09-04 DIAGNOSIS — O99210 Obesity complicating pregnancy, unspecified trimester: Secondary | ICD-10-CM

## 2023-09-04 NOTE — Progress Notes (Signed)
   PRENATAL VISIT NOTE  Subjective:  Emily Yu is a 28 y.o. G2P0010 at [redacted]w[redacted]d being seen today for ongoing prenatal care.  She is currently monitored for the following issues for this high-risk pregnancy and has Pelvic pain; Constipation; Irregular bleeding; History of hysteroscopy; Supervision of other normal pregnancy, antepartum; Hypertension in pregnancy; and Obesity in pregnancy with antepartum complication on their problem list.  Patient reports no complaints.  Contractions: Not present. Vag. Bleeding: None.  Movement: Absent. Denies leaking of fluid.   The following portions of the patient's history were reviewed and updated as appropriate: allergies, current medications, past family history, past medical history, past social history, past surgical history and problem list.   Objective:   Vitals:   09/04/23 1355 09/04/23 1401  BP: (!) 140/82 128/72  Pulse: 80 80  Weight: 198 lb (89.8 kg)      Fetal Status:     Movement: Absent     FHR- 168 General:  Alert, oriented and cooperative. Patient is in no acute distress.  Skin: Skin is warm and dry. No rash noted.   Cardiovascular: Normal heart rate noted  Respiratory: Normal respiratory effort, no problems with respiration noted  Abdomen: Soft, gravid, appropriate for gestational age.  Pain/Pressure: Absent     Pelvic: Cervical exam deferred        Extremities: Normal range of motion.  Edema: None  Mental Status: Normal mood and affect. Normal behavior. Normal judgment and thought content.   Assessment and Plan:  Pregnancy: G2P0010 at [redacted]w[redacted]d Elevated A1C- will get 2 hour GTT this week ? CHTN- Her BP at initial OB visit was elevated and I prescribed procardia but she has not yet picked up the prescription. Her first BP here today was 140/82 but on repeat SBP was 128. She will get repeat BP check this week at her 2 hour GTT visit. She will start baby asa at 13 weeks  Preterm labor symptoms and general obstetric precautions  including but not limited to vaginal bleeding, contractions, leaking of fluid and fetal movement were reviewed in detail with the patient. Please refer to After Visit Summary for other counseling recommendations.   No follow-ups on file.  Future Appointments  Date Time Provider Department Center  10/29/2023  1:15 PM [facility] NURSE [facility]  10/29/2023  1:30 PM [facility] US1 [facility]  10/29/2023  2:00 PM [facility] PROVIDER 1 [facility]    Allie Bossier, MD

## 2023-09-04 NOTE — Progress Notes (Signed)
 Pt presents for ROB visit. Pt has not started Nifedipine yet.

## 2023-09-06 ENCOUNTER — Encounter

## 2023-09-10 ENCOUNTER — Other Ambulatory Visit

## 2023-09-12 ENCOUNTER — Other Ambulatory Visit

## 2023-09-14 LAB — HORIZON CUSTOM: REPORT SUMMARY: POSITIVE — AB

## 2023-09-17 ENCOUNTER — Encounter: Payer: Self-pay | Admitting: Obstetrics & Gynecology

## 2023-09-17 DIAGNOSIS — Z148 Genetic carrier of other disease: Secondary | ICD-10-CM | POA: Insufficient documentation

## 2023-10-02 ENCOUNTER — Ambulatory Visit (INDEPENDENT_AMBULATORY_CARE_PROVIDER_SITE_OTHER): Admitting: Obstetrics and Gynecology

## 2023-10-02 VITALS — BP 142/84 | HR 116 | Wt 195.0 lb

## 2023-10-02 DIAGNOSIS — O162 Unspecified maternal hypertension, second trimester: Secondary | ICD-10-CM | POA: Diagnosis not present

## 2023-10-02 DIAGNOSIS — Z3480 Encounter for supervision of other normal pregnancy, unspecified trimester: Secondary | ICD-10-CM | POA: Diagnosis not present

## 2023-10-02 DIAGNOSIS — Z3A15 15 weeks gestation of pregnancy: Secondary | ICD-10-CM | POA: Diagnosis not present

## 2023-10-02 DIAGNOSIS — Z148 Genetic carrier of other disease: Secondary | ICD-10-CM | POA: Diagnosis not present

## 2023-10-02 NOTE — Progress Notes (Signed)
 Pt decline AFP today, may do at next appt.

## 2023-10-02 NOTE — Progress Notes (Signed)
   PRENATAL VISIT NOTE  Subjective:  Emily Yu is a 28 y.o. G2P0010 at [redacted]w[redacted]d being seen today for ongoing prenatal care.  She is currently monitored for the following issues for this high-risk pregnancy and has Pelvic pain; Constipation; Irregular bleeding; History of hysteroscopy; Supervision of other normal pregnancy, antepartum; Hypertension in pregnancy; Obesity in pregnancy with antepartum complication; and Carrier of genetic disorder on their problem list.  Patient doing well with no acute concerns today. She reports no complaints.  Contractions: Not present. Vag. Bleeding: None.  Movement: Present. Denies leaking of fluid.   The following portions of the patient's history were reviewed and updated as appropriate: allergies, current medications, past family history, past medical history, past social history, past surgical history and problem list. Problem list updated.  Objective:   Vitals:   10/02/23 1410 10/02/23 1420  BP: (!) 151/87 (!) 142/84  Pulse: (!) 118 (!) 116  Weight: 195 lb (88.5 kg)     Fetal Status: Fetal Heart Rate (bpm): 160 Fundal Height: 15 cm Movement: Present     General:  Alert, oriented and cooperative. Patient is in no acute distress.  Skin: Skin is warm and dry. No rash noted.   Cardiovascular: Normal heart rate noted  Respiratory: Normal respiratory effort, no problems with respiration noted  Abdomen: Soft, gravid, appropriate for gestational age.  Pain/Pressure: Present     Pelvic: Cervical exam deferred        Extremities: Normal range of motion.     Mental Status:  Normal mood and affect. Normal behavior. Normal judgment and thought content.   Assessment and Plan:  Pregnancy: G2P0010 at [redacted]w[redacted]d  1. [redacted] weeks gestation of pregnancy (Primary)   2. Supervision of other normal pregnancy, antepartum Continue routine prenatal care  3. Hypertension during pregnancy in second trimester, unspecified hypertension in pregnancy type BP elevated today.   She noted she has not taken her BP med today.  Pt advised the nifedipine is extended release and to optimize control, would take the medicine earlier in the day  4. 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  Please refer to After Visit Summary for other counseling recommendations.   Return in about 4 weeks (around 10/30/2023) for ROB, in person.   Mariel Aloe, MD Faculty Attending Center for [facility]

## 2023-10-29 ENCOUNTER — Ambulatory Visit: Payer: Medicaid Other

## 2023-10-30 ENCOUNTER — Ambulatory Visit: Admitting: Obstetrics

## 2023-10-30 ENCOUNTER — Encounter: Payer: Self-pay | Admitting: Obstetrics

## 2023-10-30 VITALS — BP 133/77 | HR 92 | Wt 205.2 lb

## 2023-10-30 DIAGNOSIS — Z148 Genetic carrier of other disease: Secondary | ICD-10-CM | POA: Diagnosis not present

## 2023-10-30 DIAGNOSIS — O0990 Supervision of high risk pregnancy, unspecified, unspecified trimester: Secondary | ICD-10-CM

## 2023-10-30 DIAGNOSIS — O99210 Obesity complicating pregnancy, unspecified trimester: Secondary | ICD-10-CM | POA: Diagnosis not present

## 2023-10-30 DIAGNOSIS — O162 Unspecified maternal hypertension, second trimester: Secondary | ICD-10-CM | POA: Diagnosis not present

## 2023-10-30 NOTE — Progress Notes (Signed)
 Subjective:  Emily Yu is a 28 y.o. G2P0010 at [redacted]w[redacted]d being seen today for ongoing prenatal care.  She is currently monitored for the following issues for this high-risk pregnancy and has Pelvic pain; Constipation; Irregular bleeding; History of hysteroscopy; Supervision of other normal pregnancy, antepartum; Hypertension in pregnancy; Obesity in pregnancy with antepartum complication; and Carrier of genetic disorder, SMA carrier on their problem list.  Patient reports no complaints.  Contractions: Irritability. Vag. Bleeding: None.  Movement: Present. Denies leaking of fluid.   The following portions of the patient's history were reviewed and updated as appropriate: allergies, current medications, past family history, past medical history, past social history, past surgical history and problem list. Problem list updated.  Objective:   Vitals:   10/30/23 1430  BP: 133/77  Pulse: 92  Weight: 205 lb 3.2 oz (93.1 kg)    Fetal Status: Fetal Heart Rate (bpm): 152   Movement: Present     General:  Alert, oriented and cooperative. Patient is in no acute distress.  Skin: Skin is warm and dry. No rash noted.   Cardiovascular: Normal heart rate noted  Respiratory: Normal respiratory effort, no problems with respiration noted  Abdomen: Soft, gravid, appropriate for gestational age. Pain/Pressure: Present     Pelvic:  Cervical exam deferred        Extremities: Normal range of motion.  Edema: Trace (Feet)  Mental Status: Normal mood and affect. Normal behavior. Normal judgment and thought content.   Urinalysis:      Assessment and Plan:  Pregnancy: G2P0010 at [redacted]w[redacted]d  1. Supervision of other normal pregnancy, antepartum (Primary) Rx: - AFP, Serum, Open Spina Bifida  2. Hypertension during pregnancy in second trimester, unspecified hypertension in pregnancy type - BP clinically stable  3. Obesity in pregnancy with antepartum complication   4. Carrier of genetic disorder   Preterm  labor symptoms and general obstetric precautions including but not limited to vaginal bleeding, contractions, leaking of fluid and fetal movement were reviewed in detail with the patient. Please refer to After Visit Summary for other counseling recommendations.   Return in about 4 weeks (around 11/27/2023) for ROB.   Gabrielle Joiner, MD 4/29/202

## 2023-10-30 NOTE — Progress Notes (Signed)
 Pt presents for rob. Pt has no questions or concerns at this time.

## 2023-11-01 ENCOUNTER — Encounter: Payer: Self-pay | Admitting: Family Medicine

## 2023-11-01 LAB — AFP, SERUM, OPEN SPINA BIFIDA
AFP MoM: 0.64
AFP Value: 28.9 ng/mL
Gest. Age on Collection Date: 19 wk
Maternal Age At EDD: 28.2 a
OSBR Risk 1 IN: 10000
Test Results:: NEGATIVE
Weight: 205 [lb_av]

## 2023-11-08 ENCOUNTER — Institutional Professional Consult (permissible substitution): Payer: Self-pay | Admitting: Licensed Clinical Social Worker

## 2023-11-09 ENCOUNTER — Ambulatory Visit: Payer: Self-pay | Admitting: Licensed Clinical Social Worker

## 2023-11-09 DIAGNOSIS — F4323 Adjustment disorder with mixed anxiety and depressed mood: Secondary | ICD-10-CM

## 2023-11-09 NOTE — BH Specialist Note (Unsigned)
 Integrated Behavioral Health via Telemedicine Visit  11/09/2023 Emily Yu [id number]  Number of Integrated Behavioral Health Clinician visits: No data recorded Session Start time: No data recorded  8:12am Session End time: No data recorded Total time in minutes: No data recorded  Referring Provider: Dr. Scherrie Curt Patient/Family location: Home  [facility] Provider location: Virtual Work Office  All persons participating in visit: Patient and [facility] Types of Service: Individual psychotherapy and Video visit  I connected with Emily Yu and/or Emily Yu patient via  Telephone or Video Enabled Telemedicine Application  (Video is Caregility application) and verified that I am speaking with the correct person using two identifiers. Discussed confidentiality: Yes   I discussed the limitations of telemedicine and the availability of in person appointments.  Discussed there is a possibility of technology failure and discussed alternative modes of communication if that failure occurs.  I discussed that engaging in this telemedicine visit, they consent to the provision of behavioral healthcare and the services will be billed under their insurance.  Patient and/or legal guardian expressed understanding and consented to Telemedicine visit: Yes   Presenting Concerns: Patient and/or family reports the following symptoms/concerns: *** Duration of problem: ***; Severity of problem: {Mild/Moderate/Severe:20260}  Patient and/or Family's Strengths/Protective Factors: {CHL AMB BH PROTECTIVE [phone]}  Goals Addressed: Patient will:  Reduce symptoms of: anxiety and depression   Increase knowledge and/or ability of: coping skills and healthy habits   Demonstrate ability to: Increase healthy adjustment to current life circumstances and Increase adequate support systems for patient/family  Progress towards Goals: Ongoing  Interventions: Interventions utilized:  Supportive Counseling,  Psychoeducation and/or Health Education, and Supportive Reflection Standardized Assessments completed: PHQ-SADS  Patient and/or Family Response: fears of being a new mom.. Not knowing what to expect.    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 cries a lot and thinks about ways to be a good parent. Dont know how to be a good parent but reports that her mother and grandmother are people who she can look up to.   Deep breathing, playing with dogs, listening audio books..  Have thought about hurting herself.. have tried to hurt herself in 2015..      11/09/2023    8:54 AM 08/15/2023    9:54 AM 08/15/2023    9:50 AM  PHQ-SADS Last 3 Score only  PHQ-15 Score 22    Total GAD-7 Score 20 20   PHQ Adolescent Score 26  15     Assessment: Patient currently experiencing ***.   Patient may benefit from ***.  Plan: Follow up with behavioral health clinician on : *** Behavioral recommendations: *** Referral(s): Integrated [employer] (In Clinic)  I discussed the assessment and treatment plan with the patient and/or parent/guardian. They were provided an opportunity to ask questions and all were answered. They agreed with the plan and demonstrated an understanding of the instructions.   They were advised to call back or seek an in-person evaluation if the symptoms worsen or if the condition fails to improve as anticipated.  Emily Yu, LCSWA

## 2023-11-13 ENCOUNTER — Telehealth: Payer: Self-pay

## 2023-11-13 ENCOUNTER — Other Ambulatory Visit: Payer: Self-pay | Admitting: Obstetrics and Gynecology

## 2023-11-13 ENCOUNTER — Encounter: Payer: Self-pay | Admitting: Obstetrics and Gynecology

## 2023-11-13 DIAGNOSIS — O99340 Other mental disorders complicating pregnancy, unspecified trimester: Secondary | ICD-10-CM | POA: Insufficient documentation

## 2023-11-13 MED ORDER — SERTRALINE HCL 50 MG PO TABS: 50.0000 mg | ORAL_TABLET | Freq: Every day | ORAL | 1 refills | Status: DC

## 2023-11-13 NOTE — Telephone Encounter (Signed)
 Pt was phoned conveying message from Dr Dodie Frees about pt's zoloft prescription. Instructions were given, pt voiced understanding.  Pt informed that she could pick up her prescription at her pharmacy.

## 2023-11-16 ENCOUNTER — Ambulatory Visit: Admitting: Licensed Clinical Social Worker

## 2023-11-19 NOTE — BH Specialist Note (Signed)
 Virtual link was sent to patient and a phone call was provided. Patient no showed to this appointment.

## 2023-11-27 ENCOUNTER — Encounter: Admitting: Obstetrics and Gynecology

## 2023-11-28 ENCOUNTER — Other Ambulatory Visit: Payer: Self-pay | Admitting: *Deleted

## 2023-11-28 ENCOUNTER — Ambulatory Visit: Attending: Family Medicine

## 2023-11-28 ENCOUNTER — Ambulatory Visit: Admitting: Obstetrics

## 2023-11-28 VITALS — BP 127/79

## 2023-11-28 VITALS — BP 143/67 | HR 102

## 2023-11-28 DIAGNOSIS — O10012 Pre-existing essential hypertension complicating pregnancy, second trimester: Secondary | ICD-10-CM | POA: Diagnosis not present

## 2023-11-28 DIAGNOSIS — O99340 Other mental disorders complicating pregnancy, unspecified trimester: Secondary | ICD-10-CM | POA: Diagnosis present

## 2023-11-28 DIAGNOSIS — Z368A Encounter for antenatal screening for other genetic defects: Secondary | ICD-10-CM | POA: Insufficient documentation

## 2023-11-28 DIAGNOSIS — F32A Depression, unspecified: Secondary | ICD-10-CM | POA: Diagnosis present

## 2023-11-28 DIAGNOSIS — Z3480 Encounter for supervision of other normal pregnancy, unspecified trimester: Secondary | ICD-10-CM | POA: Diagnosis not present

## 2023-11-28 DIAGNOSIS — O99342 Other mental disorders complicating pregnancy, second trimester: Secondary | ICD-10-CM | POA: Diagnosis not present

## 2023-11-28 DIAGNOSIS — Z3A23 23 weeks gestation of pregnancy: Secondary | ICD-10-CM

## 2023-11-28 DIAGNOSIS — O99210 Obesity complicating pregnancy, unspecified trimester: Secondary | ICD-10-CM | POA: Insufficient documentation

## 2023-11-28 DIAGNOSIS — O99212 Obesity complicating pregnancy, second trimester: Secondary | ICD-10-CM

## 2023-11-28 DIAGNOSIS — O10912 Unspecified pre-existing hypertension complicating pregnancy, second trimester: Secondary | ICD-10-CM | POA: Insufficient documentation

## 2023-11-28 NOTE — Progress Notes (Signed)
 MFM Consult Note  Emily Yu is currently at 23 weeks and 3 days.  She was seen due to chronic hypertension treated with nifedipine .  She denies any other significant past medical history and denies any problems in her current pregnancy.    She had a cell free DNA test earlier in her pregnancy which indicated a low risk for trisomy 79, 27, and 13.  The patient did not want the fetal gender revealed today.  She was informed that the fetal growth and amniotic fluid level were appropriate for her gestational age.   There were no obvious fetal anomalies noted on today's ultrasound exam.  However, today's exam was limited due to the fetal position.  The patient was informed that anomalies may be missed due to technical limitations. If the fetus is in a suboptimal position or maternal habitus is increased, visualization of the fetus in the maternal uterus may be impaired.  The following were discussed during today's consultation:  Chronic hypertension in pregnancy  The implications and management of chronic hypertension in pregnancy was discussed.   She was advised to continue taking nifedipine  for blood pressure control throughout her pregnancy.    Should her blood pressures be elevated later in pregnancy, the dosage of her antihypertensive medications may need to be increased.    The increased risk of superimposed preeclampsia, an indicated preterm delivery, and possible fetal growth restriction due to chronic hypertension in pregnancy was discussed.   We will continue to follow her with monthly growth scans.   Weekly fetal testing should be started at around 32 weeks.   To decrease her risk of superimposed preeclampsia, she should continue taking a daily baby aspirin  (81 mg daily) for preeclampsia prophylaxis.   A follow-up exam was scheduled in 5 weeks to assess the fetal growth and to complete the views of the fetal anatomy.    The patient stated that all of her questions were  answered today.  A total of was spent counseling and coordinating the care for this patient.  Greater than 50% of the time was spent in direct face-to-face contact.

## 2024-01-02 ENCOUNTER — Other Ambulatory Visit

## 2024-01-02 ENCOUNTER — Ambulatory Visit

## 2024-01-22 ENCOUNTER — Ambulatory Visit

## 2024-01-22 ENCOUNTER — Ambulatory Visit: Attending: Obstetrics and Gynecology | Admitting: Obstetrics and Gynecology

## 2024-01-22 VITALS — BP 123/74

## 2024-01-22 DIAGNOSIS — O10913 Unspecified pre-existing hypertension complicating pregnancy, third trimester: Secondary | ICD-10-CM | POA: Diagnosis present

## 2024-01-22 DIAGNOSIS — E669 Obesity, unspecified: Secondary | ICD-10-CM

## 2024-01-22 DIAGNOSIS — O99810 Abnormal glucose complicating pregnancy: Secondary | ICD-10-CM

## 2024-01-22 DIAGNOSIS — Z3480 Encounter for supervision of other normal pregnancy, unspecified trimester: Secondary | ICD-10-CM

## 2024-01-22 DIAGNOSIS — Z3A31 31 weeks gestation of pregnancy: Secondary | ICD-10-CM

## 2024-01-22 DIAGNOSIS — Z3689 Encounter for other specified antenatal screening: Secondary | ICD-10-CM | POA: Diagnosis not present

## 2024-01-22 DIAGNOSIS — Z148 Genetic carrier of other disease: Secondary | ICD-10-CM

## 2024-01-22 DIAGNOSIS — O99213 Obesity complicating pregnancy, third trimester: Secondary | ICD-10-CM | POA: Diagnosis not present

## 2024-01-22 DIAGNOSIS — O10013 Pre-existing essential hypertension complicating pregnancy, third trimester: Secondary | ICD-10-CM

## 2024-01-22 DIAGNOSIS — O10912 Unspecified pre-existing hypertension complicating pregnancy, second trimester: Secondary | ICD-10-CM

## 2024-01-22 DIAGNOSIS — O99340 Other mental disorders complicating pregnancy, unspecified trimester: Secondary | ICD-10-CM

## 2024-01-22 NOTE — Progress Notes (Signed)
   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 G2P0010 at [redacted]w[redacted]d here for ultrasound and consultation. Emily Yu is doing well today with no acute concerns. Today we focused on the following:   Patient has chronic hypertension that is well-controlled on Procardia .  We discussed the importance of proper blood pressure assessment during pregnancy and the need for future ultrasounds and antenatal testing.  I discussed the fetal growth is at the lower end of normal and a repeat growth be done in 3 weeks.  Sonographic findings Single intrauterine pregnancy. Fetal cardiac activity: Observed. Presentation: Cephalic. Interval fetal anatomy appears normal. Fetal biometry shows the estimated fetal weight at the 14 percentile. Amniotic fluid: Within normal limits.  MVP: 6.44 cm. Placenta: Anterior. BPP: 8/8.   There are limitations of prenatal ultrasound such as the inability to detect certain abnormalities due to poor visualization. Various factors such as fetal position, gestational age and maternal body habitus may increase the difficulty in visualizing the fetal anatomy.    Recommendations -Weekly antenatal testing until delivery -Follow-up growth ultrasound in 3 weeks -Delivery around 37-[redacted] weeks gestation  Review of Systems: A review of systems was performed and was negative except per HPI   Vitals and Physical Exam    01/22/2024    3:03 PM  11/28/2023    3:29 PM 11/28/2023    2:01 PM  Vitals with BMI  Systolic 123 127 856  Diastolic 74 79 67  Pulse   102    Sitting comfortably on the sonogram table Nonlabored breathing Normal rate and rhythm Abdomen is nontender  Past pregnancies OB History  Gravida Para Term Preterm AB Living  2 0 0 0 1 0  SAB IAB Ectopic Multiple Live Births  1 0 0 0 0    # Outcome Date GA Lbr Len/2nd Weight Sex Type Anes PTL Lv  2 Current           1 SAB 02/2020             I spent 20 minutes reviewing the patients chart, including labs and images as well as counseling the patient about her medical conditions. Greater than 50% of the time was spent in direct face-to-face patient counseling.  Delora Smaller  MFM, [facility]   01/22/2024  4:17 PM

## 2024-01-23 ENCOUNTER — Other Ambulatory Visit: Payer: Self-pay | Admitting: *Deleted

## 2024-01-23 DIAGNOSIS — O10913 Unspecified pre-existing hypertension complicating pregnancy, third trimester: Secondary | ICD-10-CM

## 2024-01-28 ENCOUNTER — Other Ambulatory Visit

## 2024-02-04 ENCOUNTER — Other Ambulatory Visit

## 2024-02-07 ENCOUNTER — Ambulatory Visit: Attending: Obstetrics and Gynecology

## 2024-02-07 DIAGNOSIS — O99213 Obesity complicating pregnancy, third trimester: Secondary | ICD-10-CM | POA: Insufficient documentation

## 2024-02-07 DIAGNOSIS — O10913 Unspecified pre-existing hypertension complicating pregnancy, third trimester: Secondary | ICD-10-CM | POA: Diagnosis not present

## 2024-02-07 DIAGNOSIS — O99210 Obesity complicating pregnancy, unspecified trimester: Secondary | ICD-10-CM

## 2024-02-07 DIAGNOSIS — O162 Unspecified maternal hypertension, second trimester: Secondary | ICD-10-CM

## 2024-02-07 DIAGNOSIS — Z3A33 33 weeks gestation of pregnancy: Secondary | ICD-10-CM | POA: Insufficient documentation

## 2024-02-07 DIAGNOSIS — O10013 Pre-existing essential hypertension complicating pregnancy, third trimester: Secondary | ICD-10-CM | POA: Insufficient documentation

## 2024-02-07 DIAGNOSIS — O163 Unspecified maternal hypertension, third trimester: Secondary | ICD-10-CM | POA: Diagnosis present

## 2024-02-07 NOTE — Procedures (Signed)
 CARAMIA BOUTIN 08-19-1995 [redacted]w[redacted]d  Fetus A Non-Stress Test Interpretation for 02/07/24  Indication: Chronic Hypertenstion - NST only  Fetal Heart Rate A Mode: External Baseline Rate (A): 140 bpm Variability: Moderate Accelerations: 15 x 15 Decelerations: None Multiple birth?: No  Uterine Activity Mode: Toco, Palpation Contraction Frequency (min): None noted Resting Tone Palpated: Relaxed  Interpretation (Fetal Testing) Nonstress Test Interpretation: Reactive Comments: Reviewed with Dr. Ileana

## 2024-02-15 ENCOUNTER — Ambulatory Visit

## 2024-02-15 ENCOUNTER — Other Ambulatory Visit

## 2024-02-15 DIAGNOSIS — Z3480 Encounter for supervision of other normal pregnancy, unspecified trimester: Secondary | ICD-10-CM

## 2024-02-15 DIAGNOSIS — O99340 Other mental disorders complicating pregnancy, unspecified trimester: Secondary | ICD-10-CM

## 2024-02-18 ENCOUNTER — Ambulatory Visit: Attending: Obstetrics and Gynecology | Admitting: *Deleted

## 2024-02-18 VITALS — BP 134/75

## 2024-02-18 DIAGNOSIS — O163 Unspecified maternal hypertension, third trimester: Secondary | ICD-10-CM | POA: Diagnosis not present

## 2024-02-18 DIAGNOSIS — Z3A35 35 weeks gestation of pregnancy: Secondary | ICD-10-CM | POA: Insufficient documentation

## 2024-02-18 DIAGNOSIS — O10013 Pre-existing essential hypertension complicating pregnancy, third trimester: Secondary | ICD-10-CM | POA: Insufficient documentation

## 2024-02-18 NOTE — Procedures (Signed)
 Emily Yu 08-01-1995 [redacted]w[redacted]d  Fetus A Non-Stress Test Interpretation for 02/18/24  Indication: Chronic Hypertenstion  Fetal Heart Rate A Mode: External Baseline Rate (A): 135 bpm Variability: Moderate Accelerations: 15 x 15 Decelerations: None Multiple birth?: No  Uterine Activity Mode: Palpation, Toco Contraction Frequency (min): none Resting Tone Palpated: Relaxed  Interpretation (Fetal Testing) Nonstress Test Interpretation: Reactive Overall Impression: Reassuring for gestational age Comments: Dr. Arna reviewed tracing

## 2024-02-27 ENCOUNTER — Encounter: Admitting: Obstetrics and Gynecology

## 2024-02-28 ENCOUNTER — Other Ambulatory Visit (HOSPITAL_COMMUNITY)
Admission: RE | Admit: 2024-02-28 | Discharge: 2024-02-28 | Disposition: A | Discharge status: Home / Self Care | Source: Ambulatory Visit | Attending: Obstetrics and Gynecology | Admitting: Obstetrics and Gynecology

## 2024-02-28 ENCOUNTER — Ambulatory Visit: Admitting: Obstetrics and Gynecology

## 2024-02-28 VITALS — BP 138/82 | HR 105 | Wt 203.0 lb

## 2024-02-28 DIAGNOSIS — O163 Unspecified maternal hypertension, third trimester: Secondary | ICD-10-CM | POA: Diagnosis not present

## 2024-02-28 DIAGNOSIS — Z3480 Encounter for supervision of other normal pregnancy, unspecified trimester: Secondary | ICD-10-CM | POA: Insufficient documentation

## 2024-02-28 DIAGNOSIS — Z3483 Encounter for supervision of other normal pregnancy, third trimester: Secondary | ICD-10-CM | POA: Diagnosis not present

## 2024-02-28 DIAGNOSIS — O99213 Obesity complicating pregnancy, third trimester: Secondary | ICD-10-CM | POA: Diagnosis not present

## 2024-02-28 DIAGNOSIS — O99210 Obesity complicating pregnancy, unspecified trimester: Secondary | ICD-10-CM

## 2024-02-28 DIAGNOSIS — Z3A36 36 weeks gestation of pregnancy: Secondary | ICD-10-CM

## 2024-02-28 NOTE — Progress Notes (Signed)
 Pt presents for ROB. Per pt, not taking procardia , feels like it interacts with zoloft .

## 2024-02-28 NOTE — Progress Notes (Signed)
   PRENATAL VISIT NOTE  Subjective:  Emily Yu is a 28 y.o. G2P0010 at [redacted]w[redacted]d being seen today for ongoing prenatal care.  She is currently monitored for the following issues for this high-risk pregnancy and has History of hysteroscopy; Supervision of other normal pregnancy, antepartum; Hypertension in pregnancy; Obesity in pregnancy with antepartum complication; Carrier of genetic disorder, SMA carrier; and Depression affecting pregnancy, antepartum on their problem list.  Patient reports no complaints.  Contractions: Irritability. Vag. Bleeding: None.  Movement: Present. Denies leaking of fluid.   The following portions of the patient's history were reviewed and updated as appropriate: allergies, current medications, past family history, past medical history, past social history, past surgical history and problem list.   Objective:   Vitals:   02/28/24 1408  BP: 138/82  Pulse: (!) 105  Weight: 203 lb (92.1 kg)   Body mass index is 33.78 kg/m. Total weight gain: 10 lb (4.536 kg)   Fetal Status: Fetal Heart Rate (bpm): 153 Fundal Height: 36 cm Movement: Present     General:  Alert, oriented and cooperative. Patient is in no acute distress.  Skin: Skin is warm and dry. No rash noted.   Cardiovascular: Normal heart rate noted  Respiratory: Normal respiratory effort, no problems with respiration noted  Abdomen: Soft, gravid, appropriate for gestational age.  Pain/Pressure: Present     Pelvic: Cervical exam deferred       Swabs collected with nurse chaperone  Extremities: Normal range of motion.  Edema: None  Mental Status: Normal mood and affect. Normal behavior. Normal judgment and thought content.   NST reactive 150s, moderate variability, +accels, no decels Toco: no regular contractions noted  Assessment and Plan:  Pregnancy: G2P0010 at [redacted]w[redacted]d 1. Supervision of other normal pregnancy, antepartum (Primary) Noncompliant with prenatal care, has been going to MFM ultrasound  appointments but last appointment here was April (19 weeks). Labs today Per MFM, IOL 37-38 wks however they were under the impression she was taking procardia . She has not taken procardia  in months and has had good BP control at home and for office checks per chart review. Will schedule IOL for 38.0 for CHTN controlled without medication  - Glucose tolerance, 1 hour - CBC - RPR - HIV antibody (with reflex) - Cervicovaginal ancillary only - Culture, beta strep (group b only)  2. Hypertension during pregnancy in third trimester, unspecified hypertension in pregnancy type Patient reports normal Bps when she checks at home, not taking procardia . She feels Bps improved on zoloft  as she feels that with her anxiety under better control, it helped her BPs Add on for NST today. Last NST was 8/18 Will try to get in for growth with MFM as last growth was 7/22 F/u in 1 week for repeat NST IOL as above  3. Obesity in pregnancy with antepartum complication   Preterm labor symptoms and general obstetric precautions including but not limited to vaginal bleeding, contractions, leaking of fluid and fetal movement were reviewed in detail with the patient. Please refer to After Visit Summary for other counseling recommendations.   Return in about 1 week (around 03/06/2024).  No future appointments.  Rollo ONEIDA Bring, MD

## 2024-02-29 ENCOUNTER — Ambulatory Visit: Payer: Self-pay | Admitting: Obstetrics and Gynecology

## 2024-02-29 DIAGNOSIS — Z3480 Encounter for supervision of other normal pregnancy, unspecified trimester: Secondary | ICD-10-CM

## 2024-02-29 DIAGNOSIS — O99019 Anemia complicating pregnancy, unspecified trimester: Secondary | ICD-10-CM

## 2024-02-29 LAB — CBC
Hematocrit: 34.3 % (ref 34.0–46.6)
Hemoglobin: 10.2 g/dL — ABNORMAL LOW (ref 11.1–15.9)
MCH: 24.2 pg — ABNORMAL LOW (ref 26.6–33.0)
MCHC: 29.7 g/dL — ABNORMAL LOW (ref 31.5–35.7)
MCV: 81 fL (ref 79–97)
Platelets: 332 x10E3/uL (ref 150–450)
RBC: 4.22 x10E6/uL (ref 3.77–5.28)
RDW: 16 % — ABNORMAL HIGH (ref 11.7–15.4)
WBC: 6.4 x10E3/uL (ref 3.4–10.8)

## 2024-02-29 LAB — CERVICOVAGINAL ANCILLARY ONLY
Chlamydia: NEGATIVE
Comment: NEGATIVE
Comment: NEGATIVE
Comment: NORMAL
Neisseria Gonorrhea: NEGATIVE
Trichomonas: NEGATIVE

## 2024-02-29 LAB — HIV ANTIBODY (ROUTINE TESTING W REFLEX): HIV Screen 4th Generation wRfx: NONREACTIVE

## 2024-02-29 LAB — GLUCOSE TOLERANCE, 1 HOUR: Glucose, 1Hr PP: 114 mg/dL (ref 70–199)

## 2024-02-29 LAB — RPR: RPR Ser Ql: NONREACTIVE

## 2024-02-29 MED ORDER — FERROUS SULFATE 325 (65 FE) MG PO TBEC: 325.0000 mg | DELAYED_RELEASE_TABLET | ORAL | 3 refills | Status: DC

## 2024-03-03 LAB — CULTURE, BETA STREP (GROUP B ONLY): Strep Gp B Culture: NEGATIVE

## 2024-03-04 ENCOUNTER — Encounter (HOSPITAL_COMMUNITY): Payer: Self-pay | Admitting: Anesthesiology

## 2024-03-04 ENCOUNTER — Encounter (HOSPITAL_COMMUNITY): Payer: Self-pay | Admitting: Obstetrics & Gynecology

## 2024-03-04 ENCOUNTER — Other Ambulatory Visit: Payer: Self-pay

## 2024-03-04 ENCOUNTER — Inpatient Hospital Stay (HOSPITAL_COMMUNITY)
Admission: AD | Admit: 2024-03-04 | Discharge: 2024-03-06 | DRG: 806 | Disposition: A | Discharge status: Home / Self Care | Attending: Family Medicine | Admitting: Family Medicine

## 2024-03-04 DIAGNOSIS — O99893 Other specified diseases and conditions complicating puerperium: Secondary | ICD-10-CM | POA: Diagnosis not present

## 2024-03-04 DIAGNOSIS — Z3A37 37 weeks gestation of pregnancy: Secondary | ICD-10-CM

## 2024-03-04 DIAGNOSIS — O1092 Unspecified pre-existing hypertension complicating childbirth: Principal | ICD-10-CM | POA: Diagnosis present

## 2024-03-04 DIAGNOSIS — Z79899 Other long term (current) drug therapy: Secondary | ICD-10-CM

## 2024-03-04 DIAGNOSIS — D62 Acute posthemorrhagic anemia: Secondary | ICD-10-CM | POA: Diagnosis not present

## 2024-03-04 DIAGNOSIS — O9081 Anemia of the puerperium: Secondary | ICD-10-CM | POA: Diagnosis not present

## 2024-03-04 DIAGNOSIS — Z833 Family history of diabetes mellitus: Secondary | ICD-10-CM | POA: Diagnosis not present

## 2024-03-04 DIAGNOSIS — O139 Gestational [pregnancy-induced] hypertension without significant proteinuria, unspecified trimester: Secondary | ICD-10-CM | POA: Diagnosis present

## 2024-03-04 DIAGNOSIS — O26893 Other specified pregnancy related conditions, third trimester: Secondary | ICD-10-CM | POA: Diagnosis present

## 2024-03-04 DIAGNOSIS — Z8616 Personal history of COVID-19: Secondary | ICD-10-CM

## 2024-03-04 DIAGNOSIS — T886XXA Anaphylactic reaction due to adverse effect of correct drug or medicament properly administered, initial encounter: Secondary | ICD-10-CM | POA: Diagnosis not present

## 2024-03-04 DIAGNOSIS — Z87891 Personal history of nicotine dependence: Secondary | ICD-10-CM | POA: Diagnosis not present

## 2024-03-04 DIAGNOSIS — O1002 Pre-existing essential hypertension complicating childbirth: Secondary | ICD-10-CM | POA: Diagnosis not present

## 2024-03-04 DIAGNOSIS — Z8249 Family history of ischemic heart disease and other diseases of the circulatory system: Secondary | ICD-10-CM | POA: Diagnosis not present

## 2024-03-04 DIAGNOSIS — O133 Gestational [pregnancy-induced] hypertension without significant proteinuria, third trimester: Principal | ICD-10-CM

## 2024-03-04 DIAGNOSIS — Z3490 Encounter for supervision of normal pregnancy, unspecified, unspecified trimester: Secondary | ICD-10-CM

## 2024-03-04 DIAGNOSIS — Z1331 Encounter for screening for depression: Secondary | ICD-10-CM | POA: Diagnosis not present

## 2024-03-04 DIAGNOSIS — Z148 Genetic carrier of other disease: Secondary | ICD-10-CM | POA: Diagnosis not present

## 2024-03-04 DIAGNOSIS — O99019 Anemia complicating pregnancy, unspecified trimester: Secondary | ICD-10-CM

## 2024-03-04 LAB — CBC
HCT: 32 % — ABNORMAL LOW (ref 36.0–46.0)
HCT: 31.1 % — ABNORMAL LOW (ref 36.0–46.0)
Hemoglobin: 9.8 g/dL — ABNORMAL LOW (ref 12.0–15.0)
Hemoglobin: 9.7 g/dL — ABNORMAL LOW (ref 12.0–15.0)
MCH: 24.1 pg — ABNORMAL LOW (ref 26.0–34.0)
MCH: 24.3 pg — ABNORMAL LOW (ref 26.0–34.0)
MCHC: 30.6 g/dL (ref 30.0–36.0)
MCHC: 31.2 g/dL (ref 30.0–36.0)
MCV: 78.8 fL — ABNORMAL LOW (ref 80.0–100.0)
MCV: 77.9 fL — ABNORMAL LOW (ref 80.0–100.0)
Platelets: 302 K/uL (ref 150–400)
Platelets: 275 K/uL (ref 150–400)
RBC: 4.06 MIL/uL (ref 3.87–5.11)
RBC: 3.99 MIL/uL (ref 3.87–5.11)
RDW: 16.5 % — ABNORMAL HIGH (ref 11.5–15.5)
RDW: 16.4 % — ABNORMAL HIGH (ref 11.5–15.5)
WBC: 9.2 K/uL (ref 4.0–10.5)
WBC: 7.9 K/uL (ref 4.0–10.5)
nRBC: 0 % (ref 0.0–0.2)
nRBC: 0 % (ref 0.0–0.2)

## 2024-03-04 LAB — COMPREHENSIVE METABOLIC PANEL WITH GFR
ALT: 12 U/L (ref 0–44)
AST: 26 U/L (ref 15–41)
Albumin: 2.9 g/dL — ABNORMAL LOW (ref 3.5–5.0)
Alkaline Phosphatase: 139 U/L — ABNORMAL HIGH (ref 38–126)
Anion gap: 12 (ref 5–15)
BUN: 5 mg/dL — ABNORMAL LOW (ref 6–20)
CO2: 18 mmol/L — ABNORMAL LOW (ref 22–32)
Calcium: 9.2 mg/dL (ref 8.9–10.3)
Chloride: 101 mmol/L (ref 98–111)
Creatinine, Ser: 0.71 mg/dL (ref 0.44–1.00)
GFR, Estimated: >60 mL/min (ref >60)
Glucose, Bld: 94 mg/dL (ref 70–99)
Potassium: 3.9 mmol/L (ref 3.5–5.1)
Sodium: 131 mmol/L — ABNORMAL LOW (ref 135–145)
Total Bilirubin: 0.5 mg/dL (ref 0.0–1.2)
Total Protein: 6.6 g/dL (ref 6.5–8.1)

## 2024-03-04 LAB — TYPE AND SCREEN
ABO/RH(D): A POS
Antibody Screen: NEGATIVE

## 2024-03-04 LAB — PROTEIN / CREATININE RATIO, URINE
Creatinine, Urine: 25 mg/dL
Total Protein, Urine: <6 mg/dL

## 2024-03-04 LAB — RPR: RPR Ser Ql: NONREACTIVE

## 2024-03-04 MED ORDER — LACTATED RINGERS IV SOLN: 500.0000 mL | INTRAVENOUS | Status: DC | PRN

## 2024-03-04 MED ORDER — TERBUTALINE SULFATE 1 MG/ML IJ SOLN: 0.2500 mg | Freq: Once | INTRAMUSCULAR | Status: DC | PRN

## 2024-03-04 MED ORDER — MISOPROSTOL 50MCG HALF TABLET
50.0000 ug | ORAL_TABLET | Freq: Once | ORAL | Status: AC
  Administered 2024-03-04: 50 ug via ORAL
  Filled 2024-03-04: qty 1

## 2024-03-04 MED ORDER — OXYTOCIN BOLUS FROM INFUSION
333.0000 mL | Freq: Once | INTRAVENOUS | Status: AC
  Administered 2024-03-04: 333 mL via INTRAVENOUS

## 2024-03-04 MED ORDER — TETANUS-DIPHTH-ACELL PERTUSSIS 5-2.5-18.5 LF-MCG/0.5 IM SUSY
0.5000 mL | PREFILLED_SYRINGE | Freq: Once | INTRAMUSCULAR | Status: AC
  Administered 2024-03-06: 0.5 mL via INTRAMUSCULAR
  Filled 2024-03-04: qty 0.5

## 2024-03-04 MED ORDER — NIFEDIPINE ER OSMOTIC RELEASE 30 MG PO TB24
30.0000 mg | ORAL_TABLET | Freq: Every day | ORAL | Status: DC
  Administered 2024-03-05 – 2024-03-06 (×2): 30 mg via ORAL
  Filled 2024-03-04 (×2): qty 1

## 2024-03-04 MED ORDER — SIMETHICONE 80 MG PO CHEW: 80.0000 mg | CHEWABLE_TABLET | ORAL | Status: DC | PRN

## 2024-03-04 MED ORDER — LACTATED RINGERS IV SOLN: INTRAVENOUS | Status: DC

## 2024-03-04 MED ORDER — OXYCODONE-ACETAMINOPHEN 5-325 MG PO TABS: 1.0000 | ORAL_TABLET | ORAL | Status: DC | PRN

## 2024-03-04 MED ORDER — DIPHENHYDRAMINE HCL 50 MG/ML IJ SOLN: 12.5000 mg | INTRAMUSCULAR | Status: DC | PRN

## 2024-03-04 MED ORDER — SENNOSIDES-DOCUSATE SODIUM 8.6-50 MG PO TABS
2.0000 | ORAL_TABLET | Freq: Every day | ORAL | Status: DC
  Administered 2024-03-05 – 2024-03-06 (×2): 2 via ORAL
  Filled 2024-03-04 (×2): qty 2

## 2024-03-04 MED ORDER — EPHEDRINE 5 MG/ML INJ: 10.0000 mg | INTRAVENOUS | Status: DC | PRN

## 2024-03-04 MED ORDER — ONDANSETRON HCL 4 MG PO TABS
4.0000 mg | ORAL_TABLET | ORAL | Status: DC | PRN
Start: 2024-03-04 — End: 2024-03-06

## 2024-03-04 MED ORDER — SOD CITRATE-CITRIC ACID 500-334 MG/5ML PO SOLN: 30.0000 mL | ORAL | Status: DC | PRN

## 2024-03-04 MED ORDER — ONDANSETRON HCL 4 MG/2ML IJ SOLN: 4.0000 mg | INTRAMUSCULAR | Status: DC | PRN

## 2024-03-04 MED ORDER — LACTATED RINGERS IV BOLUS
1000.0000 mL | Freq: Once | INTRAVENOUS | Status: AC
  Administered 2024-03-04: 1000 mL via INTRAVENOUS

## 2024-03-04 MED ORDER — SERTRALINE HCL 50 MG PO TABS
50.0000 mg | ORAL_TABLET | Freq: Every day | ORAL | Status: DC
  Administered 2024-03-05 – 2024-03-06 (×2): 50 mg via ORAL
  Filled 2024-03-04 (×2): qty 1

## 2024-03-04 MED ORDER — BENZOCAINE-MENTHOL 20-0.5 % EX AERO
1.0000 | INHALATION_SPRAY | CUTANEOUS | Status: DC | PRN
  Administered 2024-03-05: 1 via TOPICAL
  Filled 2024-03-04: qty 56

## 2024-03-04 MED ORDER — DIBUCAINE (PERIANAL) 1 % EX OINT
1.0000 | TOPICAL_OINTMENT | CUTANEOUS | Status: DC | PRN
Start: 2024-03-04 — End: 2024-03-06

## 2024-03-04 MED ORDER — ACETAMINOPHEN 325 MG PO TABS
650.0000 mg | ORAL_TABLET | ORAL | Status: DC | PRN
Start: 2024-03-04 — End: 2024-03-06
  Administered 2024-03-04 – 2024-03-05 (×3): 650 mg via ORAL
  Filled 2024-03-04 (×3): qty 2

## 2024-03-04 MED ORDER — PHENYLEPHRINE 80 MCG/ML (10ML) SYRINGE FOR IV PUSH (FOR BLOOD PRESSURE SUPPORT): 80.0000 ug | PREFILLED_SYRINGE | INTRAVENOUS | Status: DC | PRN

## 2024-03-04 MED ORDER — WITCH HAZEL-GLYCERIN EX PADS
1.0000 | MEDICATED_PAD | CUTANEOUS | Status: DC | PRN
Start: 2024-03-04 — End: 2024-03-06
  Administered 2024-03-05: 1 via TOPICAL

## 2024-03-04 MED ORDER — DIPHENHYDRAMINE HCL 25 MG PO CAPS
25.0000 mg | ORAL_CAPSULE | Freq: Four times a day (QID) | ORAL | Status: DC | PRN
  Administered 2024-03-05: 25 mg via ORAL
  Filled 2024-03-04: qty 1

## 2024-03-04 MED ORDER — PHENYLEPHRINE 80 MCG/ML (10ML) SYRINGE FOR IV PUSH (FOR BLOOD PRESSURE SUPPORT)
80.0000 ug | PREFILLED_SYRINGE | INTRAVENOUS | Status: DC | PRN
  Filled 2024-03-04: qty 10

## 2024-03-04 MED ORDER — LIDOCAINE HCL (PF) 1 % IJ SOLN
30.0000 mL | INTRAMUSCULAR | Status: DC | PRN
  Filled 2024-03-04: qty 30

## 2024-03-04 MED ORDER — MISOPROSTOL 25 MCG QUARTER TABLET
25.0000 ug | ORAL_TABLET | Freq: Once | ORAL | Status: AC
  Administered 2024-03-04: 25 ug via VAGINAL
  Filled 2024-03-04: qty 1

## 2024-03-04 MED ORDER — OXYTOCIN-SODIUM CHLORIDE 30-0.9 UT/500ML-% IV SOLN
2.5000 [IU]/h | INTRAVENOUS | Status: DC
  Filled 2024-03-04: qty 500

## 2024-03-04 MED ORDER — COCONUT OIL OIL
1.0000 | TOPICAL_OIL | Status: DC | PRN
  Administered 2024-03-04: 1 via TOPICAL

## 2024-03-04 MED ORDER — OXYCODONE-ACETAMINOPHEN 5-325 MG PO TABS: 2.0000 | ORAL_TABLET | ORAL | Status: DC | PRN

## 2024-03-04 MED ORDER — PRENATAL MULTIVITAMIN CH
1.0000 | ORAL_TABLET | Freq: Every day | ORAL | Status: DC
Start: 2024-03-05 — End: 2024-03-06
  Administered 2024-03-05 – 2024-03-06 (×2): 1 via ORAL
  Filled 2024-03-04 (×2): qty 1

## 2024-03-04 MED ORDER — ONDANSETRON HCL 4 MG/2ML IJ SOLN: 4.0000 mg | Freq: Four times a day (QID) | INTRAMUSCULAR | Status: DC | PRN

## 2024-03-04 MED ORDER — ZOLPIDEM TARTRATE 5 MG PO TABS
5.0000 mg | ORAL_TABLET | Freq: Every evening | ORAL | Status: DC | PRN
Start: 2024-03-04 — End: 2024-03-06

## 2024-03-04 MED ORDER — IBUPROFEN 600 MG PO TABS
600.0000 mg | ORAL_TABLET | Freq: Four times a day (QID) | ORAL | Status: DC
  Administered 2024-03-04 – 2024-03-06 (×8): 600 mg via ORAL
  Filled 2024-03-04 (×8): qty 1

## 2024-03-04 MED ORDER — FENTANYL CITRATE (PF) 100 MCG/2ML IJ SOLN
50.0000 ug | INTRAMUSCULAR | Status: DC | PRN
  Administered 2024-03-04 (×2): 100 ug via INTRAVENOUS
  Filled 2024-03-04 (×2): qty 2

## 2024-03-04 MED ORDER — LACTATED RINGERS IV SOLN
500.0000 mL | Freq: Once | INTRAVENOUS | Status: AC
  Administered 2024-03-04: 500 mL via INTRAVENOUS

## 2024-03-04 MED ORDER — FENTANYL-BUPIVACAINE-NACL 0.5-0.125-0.9 MG/250ML-% EP SOLN
12.0000 mL/h | EPIDURAL | Status: DC | PRN
  Filled 2024-03-04: qty 250

## 2024-03-04 MED ORDER — ACETAMINOPHEN 325 MG PO TABS
650.0000 mg | ORAL_TABLET | ORAL | Status: DC | PRN
Start: 2024-03-04 — End: 2024-03-04

## 2024-03-04 MED ORDER — ONDANSETRON HCL 4 MG/2ML IJ SOLN
4.0000 mg | Freq: Once | INTRAMUSCULAR | Status: AC
  Administered 2024-03-04: 4 mg via INTRAVENOUS
  Filled 2024-03-04: qty 2

## 2024-03-04 NOTE — H&P (Signed)
 OBSTETRIC ADMISSION HISTORY AND PHYSICAL  Emily Yu is a 28 y.o. female G2P0010 with IUP at [redacted]w[redacted]d (dated by [redacted]w[redacted]d US , Estimated Date of Delivery: 03/23/24) presenting initially in the MAU due to contractions. Cervical exam remained unchanged in the MAU but MFM recommended IOL due to [facility]. Patient was amenable to come in.   She reports +FMs, No LOF, no VB, no blurry vision, headaches or peripheral edema, and RUQ pain.    She plans on breast feeding. She declines birth control  She received her prenatal care at [facility]   Prenatal History/Complications:   cHTN SMA carrier Depression   Past Medical History: Past Medical History:  Diagnosis Date   Anemia    Concussion 12 yrs ago   no residual had temporary memory loss   COVID 07/26/2020   bulge on neck x 7 days all symptoms resolved   Endometrial mass     Past Surgical History: Past Surgical History:  Procedure Laterality Date   HYSTEROSCOPY WITH D & C N/A 09/08/2020   Procedure: DILATATION AND CURETTAGE /HYSTEROSCOPY AND RESECTION OF ENDOMETRIAL MASS;  Surgeon: Cleotilde Ronal GORMAN, MD;  Location: [facility];  Service: Gynecology;  Laterality: N/A;   wisdome teeth exaction     WRIST SURGERY Left 12 yrs ago   had pins later removed    Obstetrical History: OB History     Gravida  2   Para  0   Term  0   Preterm  0   AB  1   Living  0      SAB  1   IAB  0   Ectopic  0   Multiple  0   Live Births  0           Social History Social History   Socioeconomic History   Marital status: Single    Spouse name: Not on file   Number of children: Not on file   Years of education: Not on file   Highest education level: Not on file  Occupational History   Not on file  Tobacco Use   Smoking status: Former    Types: E-cigarettes   Smokeless tobacco: Never  Vaping Use   Vaping status: Former   Substances: Nicotine, CBD  Substance and Sexual Activity   Alcohol use: Not Currently    Comment:  occ   Drug use: Not Currently   Sexual activity: Not Currently    Birth control/protection: None  Other Topics Concern   Not on file  Social History Narrative   Not on file   Social Drivers of Health   Financial Resource Strain: Not on file  Food Insecurity: No Food Insecurity (07/22/2020)   Hunger Vital Sign    Worried About Running Out of Food in the Last Year: Never true    Ran Out of Food in the Last Year: Never true  Transportation Needs: No Transportation Needs (07/22/2020)   PRAPARE - Administrator, Civil Service (Medical): No    Lack of Transportation (Non-Medical): No  Physical Activity: Not on file  Stress: Not on file  Social Connections: Not on file    Family History: Family History  Problem Relation Age of Onset   Hypertension Mother    Diabetes Other    Hypertension Other     Allergies: Allergies  Allergen Reactions   Lactose Intolerance (Gi) Other (See Comments)    Stomach pain, cramping, nausea    Medications Prior to Admission  Medication Sig Dispense Refill Last Dose/Taking   Prenatal Vit-Fe Fumarate-FA (PRENATAL VITAMINS PO) Take 1 tablet by mouth daily.   03/04/2024 Morning   sertraline  (ZOLOFT ) 50 MG tablet Take 1 tablet (50 mg total) by mouth daily. 30 tablet 1 03/04/2024 Morning   albuterol  (VENTOLIN  HFA) 108 (90 Base) MCG/ACT inhaler Inhale 2 puffs into the lungs every 6 (six) hours as needed for shortness of breath (or cough). (Patient not taking: Reported on 02/28/2024) 18 g 0    aspirin  81 MG chewable tablet Chew 1 tablet (81 mg total) by mouth daily. (Patient not taking: Reported on 02/28/2024) 90 tablet 3    Blood Pressure Monitoring (BLOOD PRESSURE KIT) DEVI 1 Device by Does not apply route once a week. 1 each 0    ferrous sulfate  325 (65 FE) MG EC tablet Take 1 tablet (325 mg total) by mouth every other day. 30 tablet 3    NIFEdipine  (PROCARDIA  XL) 30 MG 24 hr tablet Take 1 tablet (30 mg total) by mouth daily. (Patient not taking:  Reported on 02/28/2024) 90 tablet 3    Prenatal Vit-Fe Phos-FA-Omega (VITAFOL  GUMMIES) 3.33-0.333-34.8 MG CHEW Chew 3 tablets by mouth daily. 90 tablet 11      Review of Systems  All systems reviewed and negative except as stated in HPI.  Blood pressure (!) 139/95, pulse 75, temperature 97.9 F (36.6 C), temperature source Oral, resp. rate (!) 22, height 5' 5 (1.651 m), weight 92.1 kg, last menstrual period 06/10/2023, SpO2 99%. General appearance: alert, cooperative, and appears stated age Lungs: breathing comfortably on room air Heart: regular rate Abdomen: soft, non-tender; gravid Extremities: no edema of bilateral lower extremities DTR's intact Presentation: cephalic Fetal monitoringBaseline: 140 bpm, Variability: Good {> 6 bpm), Accelerations: Reactive, and Decelerations: Absent Uterine activityFrequency: Every 5-20 minutes  Dilation: 1.5 Effacement (%): 30, 40 Station: -3 Exam by:: Benton Medley, RN   Prenatal labs: ABO, Rh: A/Positive/-- (02/25 1653) Antibody: Negative (02/25 1653) Rubella: 5.58 (02/25 1653) RPR: Non Reactive (08/28 1529)  HBsAg: Negative (02/25 1653)  HIV: Non Reactive (08/28 1529)  GBS: Negative/-- (08/28 1450)  2 hr Glucola normal Genetic screening  AFP: Negative Anatomy US  appears normal Last US : At [redacted]w[redacted]d - cephalic presentation, EFW 1560 (14 %tile), AC 27%  Prenatal Transfer Tool  Maternal Diabetes: No Genetic Screening: Normal Maternal Ultrasounds/Referrals: Normal Fetal Ultrasounds or other Referrals:  None Maternal Substance Abuse:  No Significant Maternal Medications:  None Significant Maternal Lab Results:  Group B Strep negative Number of Prenatal Visits:greater than 3 verified prenatal visits Other Comments:  None  Results for orders placed or performed during the hospital encounter of 03/04/24 (from the past 24 hours)  CBC   Collection Time: 03/04/24  1:45 AM  Result Value Ref Range   WBC 9.2 4.0 - 10.5 K/uL   RBC 4.06 3.87  - 5.11 MIL/uL   Hemoglobin 9.8 (L) 12.0 - 15.0 g/dL   HCT 67.9 (L) 63.9 - 53.9 %   MCV 78.8 (L) 80.0 - 100.0 fL   MCH 24.1 (L) 26.0 - 34.0 pg   MCHC 30.6 30.0 - 36.0 g/dL   RDW 83.4 (H) 88.4 - 84.4 %   Platelets 302 150 - 400 K/uL   nRBC 0.0 0.0 - 0.2 %  Comprehensive metabolic panel   Collection Time: 03/04/24  1:45 AM  Result Value Ref Range   Sodium 131 (L) 135 - 145 mmol/L   Potassium 3.9 3.5 - 5.1 mmol/L   Chloride 101 98 -  111 mmol/L   CO2 18 (L) 22 - 32 mmol/L   Glucose, Bld 94 70 - 99 mg/dL   BUN 5 (L) 6 - 20 mg/dL   Creatinine, Ser 9.28 0.44 - 1.00 mg/dL   Calcium 9.2 8.9 - 89.6 mg/dL   Total Protein 6.6 6.5 - 8.1 g/dL   Albumin 2.9 (L) 3.5 - 5.0 g/dL   AST 26 15 - 41 U/L   ALT 12 0 - 44 U/L   Alkaline Phosphatase 139 (H) 38 - 126 U/L   Total Bilirubin 0.5 0.0 - 1.2 mg/dL   GFR, Estimated >39 >39 mL/min   Anion gap 12 5 - 15  Protein / creatinine ratio, urine   Collection Time: 03/04/24  1:45 AM  Result Value Ref Range   Creatinine, Urine 25 mg/dL   Total Protein, Urine <6 mg/dL   Protein Creatinine Ratio        0.00 - 0.15 mg/mg[Cre]    Patient Active Problem List   Diagnosis Date Noted   Depression affecting pregnancy, antepartum 11/13/2023   Carrier of genetic disorder, SMA carrier 09/17/2023   Hypertension in pregnancy 08/28/2023   Obesity in pregnancy with antepartum complication 08/28/2023   Supervision of other normal pregnancy, antepartum 08/15/2023   History of hysteroscopy 11/07/2020    Assessment/Plan:  CHEYANN BLECHA is a 28 y.o. G2P0010 at [redacted]w[redacted]d here for IOL due to gHTN  #Labor: Induction of labor process explained to the patient. Will start with dual Cytotec  #Pain: IV meds and nitrous, prefers not to get an epidural #FWB: Cat I tracing #ID:  GBS negative #MOF: breast #MOC: declines #Circ:  N/A   Barkley Angles, MD OB Fellow, Faculty Practice [place], [facility] 03/04/2024 3:49 AM

## 2024-03-04 NOTE — MAU Provider Note (Signed)
 History     CSN: [id number]  Arrival date and time: 03/04/24 0040   None     Chief Complaint  Patient presents with   Contractions   Labor Eval   Patient is 28 y/o G2P0010 at [redacted]w[redacted]d who presents to MAU with ongoing contractions. She has history of cHTN not on medications. Complains of contractions every 3 minutes that have spaced out since she arrived.     Past Medical History:  Diagnosis Date   Anemia    Concussion 12 yrs ago   no residual had temporary memory loss   COVID 07/26/2020   bulge on neck x 7 days all symptoms resolved   Endometrial mass     Past Surgical History:  Procedure Laterality Date   HYSTEROSCOPY WITH D & C N/A 09/08/2020   Procedure: DILATATION AND CURETTAGE /HYSTEROSCOPY AND RESECTION OF ENDOMETRIAL MASS;  Surgeon: Cleotilde Ronal RAMAN, MD;  Location: [facility];  Service: Gynecology;  Laterality: N/A;   wisdome teeth exaction     WRIST SURGERY Left 12 yrs ago   had pins later removed    Family History  Problem Relation Age of Onset   Hypertension Mother    Diabetes Other    Hypertension Other     Social History   Tobacco Use   Smoking status: Former    Types: E-cigarettes   Smokeless tobacco: Never  Vaping Use   Vaping status: Former   Substances: Nicotine, CBD  Substance Use Topics   Alcohol use: Not Currently    Comment: occ   Drug use: Not Currently    Allergies:  Allergies  Allergen Reactions   Lactose Intolerance (Gi) Other (See Comments)    Stomach pain, cramping, nausea    Medications Prior to Admission  Medication Sig Dispense Refill Last Dose/Taking   Prenatal Vit-Fe Fumarate-FA (PRENATAL VITAMINS PO) Take 1 tablet by mouth daily.   03/04/2024 Morning   sertraline  (ZOLOFT ) 50 MG tablet Take 1 tablet (50 mg total) by mouth daily. 30 tablet 1 03/04/2024 Morning   albuterol  (VENTOLIN  HFA) 108 (90 Base) MCG/ACT inhaler Inhale 2 puffs into the lungs every 6 (six) hours as needed for shortness of breath (or cough).  (Patient not taking: Reported on 02/28/2024) 18 g 0    aspirin  81 MG chewable tablet Chew 1 tablet (81 mg total) by mouth daily. (Patient not taking: Reported on 02/28/2024) 90 tablet 3    Blood Pressure Monitoring (BLOOD PRESSURE KIT) DEVI 1 Device by Does not apply route once a week. 1 each 0    ferrous sulfate  325 (65 FE) MG EC tablet Take 1 tablet (325 mg total) by mouth every other day. 30 tablet 3    NIFEdipine  (PROCARDIA  XL) 30 MG 24 hr tablet Take 1 tablet (30 mg total) by mouth daily. (Patient not taking: Reported on 02/28/2024) 90 tablet 3    Prenatal Vit-Fe Phos-FA-Omega (VITAFOL  GUMMIES) 3.33-0.333-34.8 MG CHEW Chew 3 tablets by mouth daily. 90 tablet 11     ROS: Negative aside from what is listed in the HPI.  Physical Exam   Blood pressure (!) 139/95, pulse 75, temperature 97.9 F (36.6 C), temperature source Oral, resp. rate (!) 22, height 5' 5 (1.651 m), weight 92.1 kg, last menstrual period 06/10/2023, SpO2 99%.  Physical Exam Vitals and nursing note reviewed.  Constitutional:      Appearance: She is well-developed.  HENT:     Head: Normocephalic and atraumatic.     Mouth/Throat:  Mouth: Mucous membranes are moist.  Eyes:     Extraocular Movements: Extraocular movements intact.  Cardiovascular:     Rate and Rhythm: Normal rate and regular rhythm.  Pulmonary:     Effort: Pulmonary effort is normal.  Abdominal:     Palpations: Abdomen is soft.     Tenderness: There is no abdominal tenderness.  Skin:    Capillary Refill: Capillary refill takes less than 2 seconds.  Neurological:     General: No focal deficit present.     Mental Status: She is alert.     MAU Course    MDM Low  Assessment and Plan   Patient is 28 y/o G2P0010 at [redacted]w[redacted]d who presents to MAU with ongoing contractions.   -BP on admission 147/94. Did improve mildly. PIH labs negative. -Contractions spaced out, and no cervical change in 2.5 hours  -Will admit per MFM recommendations due to  elevated BP's in the setting of cHTN. -See H&P for further details.   Shaleta Ruacho L Jamarkis Branam 03/04/2024, 3:30 AM

## 2024-03-04 NOTE — Anesthesia Preprocedure Evaluation (Signed)
 Anesthesia Evaluation    Reviewed: Allergy & Precautions, Patient's Chart, lab work & pertinent test results  Airway        Dental   Pulmonary former smoker          Cardiovascular hypertension,      Neuro/Psych    GI/Hepatic negative GI ROS, Neg liver ROS,,,  Endo/Other  negative endocrine ROS    Renal/GU negative Renal ROS     Musculoskeletal   Abdominal   Peds  Hematology  (+) Blood dyscrasia, anemia   Anesthesia Other Findings   Reproductive/Obstetrics (+) Pregnancy                              Anesthesia Physical Anesthesia Plan  ASA: 2  Anesthesia Plan: Epidural   Post-op Pain Management:    Induction:   PONV Risk Score and Plan:   Airway Management Planned: Natural Airway  Additional Equipment: None  Intra-op Plan:   Post-operative Plan:   Informed Consent:   Plan Discussed with:   Anesthesia Plan Comments:         Anesthesia Quick Evaluation

## 2024-03-04 NOTE — Lactation Note (Signed)
 This note was copied from a baby's chart. Lactation Consultation Note  Patient Name: Emily Yu Date: 03/04/2024 Age:28 hours Reason for consult: Initial assessment;1st time breastfeeding;Early term 37-38.6wks. MOB with hx of CHTN.  P1, ETI , MOB latched infant on her left breast using the football hold position, infant was on and off the breast, infant breastfeed for 15 minutes. MOB was taught hand expression and infant was given 1 ml of colostrum by spoon. MOB will continue to work on breastfeed infant and will continue to ask for latch assistance. MOB will continue to breastfeed infant by cues, 8+ times within 24 hours, skin to skin. LC discussed the importance of maternal rest, meals and hydration. MOB was  made aware of O/P services, breastfeeding support groups, community resources, and our phone # for post-discharge questions.    Maternal Data Has patient been taught Hand Expression?: Yes Does the patient have breastfeeding experience prior to this delivery?: No  Feeding Mother's Current Feeding Choice: Breast Milk  LATCH Score Latch: Repeated attempts needed to sustain latch, nipple held in mouth throughout feeding, stimulation needed to elicit sucking reflex.  Audible Swallowing: A few with stimulation  Type of Nipple: Everted at rest and after stimulation  Comfort (Breast/Nipple): Soft / non-tender  Hold (Positioning): Assistance needed to correctly position infant at breast and maintain latch.  LATCH Score: 7   Lactation Tools Discussed/Used    Interventions Interventions: Breast feeding basics reviewed;Assisted with latch;Skin to skin;Breast compression;Adjust position;Support pillows;Position options;Hand express;Expressed milk;Education;DEBP;Guidelines for Milk Supply and Pumping Schedule Handout;LC Services brochure;CDC milk storage guidelines;CDC Guidelines for Breast Pump Cleaning  Discharge Pump: DEBP;Personal  Consult Status Consult Status:  Follow-up Date: 03/05/24 Follow-up type: In-patient    Grayce LULLA Batter 03/04/2024, 6:25 PM

## 2024-03-04 NOTE — Progress Notes (Addendum)
 Labor Progress Note Emily Yu is a 28 y.o. G2P0010 at [redacted]w[redacted]d presented for IOL for cHTN  S:  Visibly uncomfortable during contractions, would like AROM at this time  O:  BP 135/74   Pulse 84   Temp 98 F (36.7 C) (Oral)   Resp 18   Ht 5' 5 (1.651 m)   Wt 92.1 kg   LMP 06/10/2023 (Approximate)   SpO2 99%   BMI 33.78 kg/m  EFM: baseline 120 bpm/ moderate variability/ + accels/ absent decels  Toco/IUPC: CTX q34min SVE: Dilation: 4 Effacement (%): 60 Cervical Position: Posterior Station: -3 Presentation: Vertex Exam by:: Lum Sharps   A/P: 28 y.o. G2P0010 [redacted]w[redacted]d  1. Labor: Augmentation with AROM, clear fluid 2. FWB: Baseline 120, mod variability, +accels, decels absent, CTX q2-7min 3. Pain: nitrous oxide   [name] A Edi Gorniak, MD 11:11 AM

## 2024-03-04 NOTE — MAU Note (Signed)
 MAU Triage Note:  .Emily Yu is a 28 y.o. at [redacted]w[redacted]d here in MAU reporting: on-going contractions for the past several days. Denies watery LOF or VB. Reports +FM. Reports h/o CHTN and not taking any BP medications.   Patient complaint: ctx lower back pains  Pain Score: 6  Pain Location: Abdomen    Labor Pain Management Plan: IV pain medication and Nitrous Oxide  GBS: Negative  Onset of complaint: on-going LMP: Patient's last menstrual period was 06/10/2023 (approximate).  Vitals:   03/04/24 0057 03/04/24 0101  BP: (!) 147/94 (!) 145/89  Pulse: 78 80  Resp: (!) 22   Temp: 97.9 F (36.6 C)   SpO2: 100%     FHT:  Fetal Heart Rate Mode: External Baseline Rate (A): 135 bpm Lab orders placed from triage: N/A

## 2024-03-04 NOTE — Discharge Summary (Signed)
 Postpartum Discharge Summary  Date of Service updated***     Patient Name: Emily Yu DOB: 02/16/1996 MRN: [id number]  Date of admission: 03/04/2024 Delivery date:03/04/2024 Delivering provider: JOMARIE CAMPI A Date of discharge: 03/04/2024  Admitting diagnosis: Gestational hypertension [O13.9] Intrauterine pregnancy: [redacted]w[redacted]d     Secondary diagnosis:  Principal Problem:   Gestational hypertension  Additional problems: none    Discharge diagnosis: Term Pregnancy Delivered and CHTN                                              Post partum procedures:{Postpartum procedures:23558} Augmentation: AROM and Cytotec  Complications: None  Hospital course: Induction of Labor With Vaginal Delivery   28 y.o. yo G2P1011 at [redacted]w[redacted]d was admitted to the hospital 03/04/2024 for induction of labor.  Indication for induction: Gestational hypertension.  Patient had an uncomplicated labor course Membrane Rupture Time/Date: 11:08 AM,03/04/2024  Delivery Method:Vaginal, Spontaneous Operative Delivery:N/A Episiotomy: None Lacerations:  2nd degree Details of delivery can be found in separate delivery note.  Patient had a postpartum course complicated by***. Patient is discharged home 03/04/24.  Newborn Data: Birth date:03/04/2024 Birth time:1:54 PM Gender:Female Living status:Living Apgars:9 ,9  Weight:2610 g  Magnesium  Sulfate received: No BMZ received: No Rhophylac:N/A MMR:N/A T-DaP:{Tdap:23962} Flu: No RSV Vaccine received: No Transfusion:No  Immunizations received: Immunization History  Administered Date(s) Administered   Tdap 02/06/2019    Physical exam  Vitals:   03/04/24 1358 03/04/24 1401 03/04/24 1416 03/04/24 1432  BP: 126/83 131/81 (!) 145/86 (!) 158/87  Pulse: 74 80 68 72  Resp:      Temp:      TempSrc:      SpO2:      Weight:      Height:       General: {Exam; general:21111117} Lochia: {Desc; appropriate/inappropriate:30686::appropriate} Uterine Fundus: {Desc;  firm/soft:30687} Incision: {Exam; incision:21111123} DVT Evaluation: {Exam; dvt:2111122} Labs: Lab Results  Component Value Date   WBC 7.9 03/04/2024   HGB 9.7 (L) 03/04/2024   HCT 31.1 (L) 03/04/2024   MCV 77.9 (L) 03/04/2024   PLT 275 03/04/2024      Latest Ref Rng & Units 03/04/2024    1:45 AM  CMP  Glucose 70 - 99 mg/dL 94   BUN 6 - 20 mg/dL 5   Creatinine 9.55 - 8.99 mg/dL 9.28   Sodium 864 - 854 mmol/L 131   Potassium 3.5 - 5.1 mmol/L 3.9   Chloride 98 - 111 mmol/L 101   CO2 22 - 32 mmol/L 18   Calcium 8.9 - 10.3 mg/dL 9.2   Total Protein 6.5 - 8.1 g/dL 6.6   Total Bilirubin 0.0 - 1.2 mg/dL 0.5   Alkaline Phos 38 - 126 U/L 139   AST 15 - 41 U/L 26   ALT 0 - 44 U/L 12    Edinburgh Score:     No data to display         No data recorded  After visit meds:  Allergies as of 03/04/2024       Reactions   Lactose Intolerance (gi) Other (See Comments)   Stomach pain, cramping, nausea     Med Rec must be completed prior to using this [facility]***        Discharge home in stable condition Infant Feeding: {Baby feeding:23562} Infant Disposition:{CHL IP OB HOME WITH FNUYZM:76418} Discharge instruction: per After Visit  Summary and Postpartum booklet. Activity: Advance as tolerated. Pelvic rest for 6 weeks.  Diet: {OB ipzu:78888878} Future Appointments: Future Appointments  Date Time Provider Department Center  03/06/2024  2:30 PM Letha Renshaw, CNM [facility] None    Follow up Visit:    Please schedule this patient for a In person postpartum visit in 6 weeks with the following provider: MD and APP.  Additional Postpartum F/U: BP check 1 week  High risk pregnancy complicated by: HTN  Delivery mode:  Vaginal, Spontaneous  Anticipated Birth Control:   declines    Message sent to [facility] on 03/04/2024

## 2024-03-05 LAB — CBC
HCT: 24.9 % — ABNORMAL LOW (ref 36.0–46.0)
Hemoglobin: 7.8 g/dL — ABNORMAL LOW (ref 12.0–15.0)
MCH: 24.9 pg — ABNORMAL LOW (ref 26.0–34.0)
MCHC: 31.3 g/dL (ref 30.0–36.0)
MCV: 79.6 fL — ABNORMAL LOW (ref 80.0–100.0)
Platelets: 267 K/uL (ref 150–400)
RBC: 3.13 MIL/uL — ABNORMAL LOW (ref 3.87–5.11)
RDW: 16.4 % — ABNORMAL HIGH (ref 11.5–15.5)
WBC: 13.9 K/uL — ABNORMAL HIGH (ref 4.0–10.5)
nRBC: 0 % (ref 0.0–0.2)

## 2024-03-05 MED ORDER — PANTOPRAZOLE SODIUM 40 MG IV SOLR
40.0000 mg | Freq: Once | INTRAVENOUS | Status: AC
  Administered 2024-03-05: 40 mg via INTRAVENOUS
  Filled 2024-03-05: qty 10

## 2024-03-05 MED ORDER — SODIUM CHLORIDE 0.9 % IV SOLN
500.0000 mg | Freq: Once | INTRAVENOUS | Status: AC
  Administered 2024-03-05: 500 mg via INTRAVENOUS
  Filled 2024-03-05: qty 25

## 2024-03-05 MED ORDER — FUROSEMIDE 20 MG PO TABS
20.0000 mg | ORAL_TABLET | Freq: Every day | ORAL | Status: DC
  Administered 2024-03-05 – 2024-03-06 (×2): 20 mg via ORAL
  Filled 2024-03-05 (×2): qty 1

## 2024-03-05 MED ORDER — POTASSIUM CHLORIDE CRYS ER 20 MEQ PO TBCR
20.0000 meq | EXTENDED_RELEASE_TABLET | Freq: Two times a day (BID) | ORAL | Status: DC
  Administered 2024-03-05 – 2024-03-06 (×3): 20 meq via ORAL
  Filled 2024-03-05 (×3): qty 1

## 2024-03-05 MED ORDER — OXYCODONE HCL 5 MG PO TABS
5.0000 mg | ORAL_TABLET | Freq: Once | ORAL | Status: AC
  Administered 2024-03-05: 5 mg via ORAL
  Filled 2024-03-05: qty 1

## 2024-03-05 NOTE — Progress Notes (Signed)
 Notified by nursing staff via secure chat I just went and checked on the pt she is stating that the pain in her chest is getting worst and she is having itching in her throat and body along with shortness of breath. CNM responded to check VS and en route to bedside.   Lung sounds clear, unlabored breathing.  VSS.  No urticaria noted.  Labs pending - UA and CMP.  Will add EKG for chest pain.  Will add pantoprazole  as adjunct to benadryl  for potential hypersensitivity reaction.   Continue to monitor.  Camie Rote, MSN, CNM, RNC-OB Certified Nurse Midwife, [facility] 03/05/2024 7:34 PM

## 2024-03-05 NOTE — Patient Instructions (Signed)
 Your appointment with Outpatient Lactation is: Date:03/17/2024 Time:3:30 pm [facility] (First Floor) [address] [place]  Check in under baby's name.  Please bring your baby hungry along with your pump and a bottle of either formula or expressed breast milk. Please also bring your pump flanges and we welcome support people! If you need lactation assistance before your appointment, please call [phone] and press 4 for lactation.   -- Lactation support groups:  [facility], Tuesdays 10:00 am -12:00 pm at [address] on the second floor in the conference room, lactating parents and lap babies welcome.  [email or web address]  [email or web address]

## 2024-03-05 NOTE — Progress Notes (Signed)
 POSTPARTUM PROGRESS NOTE  Subjective: Emily Yu is a 28 y.o. G2P1011 s/p SVB at [redacted]w[redacted]d.  She reports she doing well. No acute events overnight. She denies any problems with ambulating, voiding or po intake. Denies nausea or vomiting. She has  passed flatus. Pain is moderately controlled.  Lochia is appropriate.  Objective: Blood pressure 126/75, pulse 84, temperature (!) 97.5 F (36.4 C), temperature source Oral, resp. rate 18, height 5' 5 (1.651 m), weight 92.1 kg, last menstrual period 06/10/2023, SpO2 99%, unknown if currently breastfeeding.  Physical Exam:  General: alert, cooperative and no distress Chest: no respiratory distress Abdomen: soft, non-tender  Uterine Fundus: firm, appropriately tender Extremities: No calf swelling or tenderness  trace edema  Recent Labs    03/04/24 1307 03/05/24 0514  HGB 9.7* 7.8*  HCT 31.1* 24.9*    Assessment/Plan: Emily Yu is a 28 y.o. G2P1011 s/p SVB at [redacted]w[redacted]d  Routine Postpartum Care: Doing well, pain well-controlled.  -- Continue routine care, lactation support  -- Contraception: none -- Feeding: breast -- Anemia: Hgb drop from 9.8 on admission to 7.8 on AM CBC. Patient reports symptomatic, IV Venofer  ordered.  -- Abdominal binder order d/t back pain. Requested dermaplast, witch hazel from RN staff.   Dispo: Plan for discharge 03/06/24.  Camie Rote, MSN, CNM, RNC-OB Certified Nurse Midwife, [facility] 03/05/2024 10:29 AM

## 2024-03-05 NOTE — Progress Notes (Signed)
 CNM to bedside for pain with breathing following IV venofer  infusion. Upon further assessment, patient with bilateral CVA tenderness, joint pain and paresthesias. IV iron  completed shortly before symptoms per RN staff.   VSS.  Lung sounds clear.  Strength and grip equal bilaterally.  Benadryl  25mg  given orally by RN staff.  UA and CMP to evaluate incidental UTI and kidney function.  Continue to monitor.   Emily Rote, MSN, CNM, RNC-OB Certified Nurse Midwife, [facility] 03/05/2024 6:35 PM

## 2024-03-05 NOTE — Social Work (Signed)
 CSW received consult for hx of Depression.  CSW met with MOB to offer support and complete assessment.  CSW entered the room and observed MOB resting in bed holding the infant and her mom and dad at bedside. CSW introduced self, CSW role and reason for visit, MOB was agreeable to visit and allowed her family to remain in the room.  CSW inquired about how MOB was feeling, MOB reported good. CSW inquired about MOB MH hx MOB reported she deos not recall when she was diagnosed with Depression, MOB reported she take Zoloft  50mg  which has been helpful. CSW inquired about MOB mood over past 7 days, MOB reported feeling anxious about the delivery of infant. CSW assessed for safety, MOB denied any SI or HI. CSW provided education regarding the baby blues period vs. perinatal mood disorders, discussed treatment and gave resources for mental health follow up if concerns arise.  CSW recommends self-evaluation during the postpartum time period using the New Mom Checklist from Postpartum Progress and encouraged MOB to contact a medical professional if symptoms are noted at any time.  MOB identified her mom, dad and b=grandmother as her support.  CSW provided review of Sudden Infant Death Syndrome (SIDS) precautions.  MOB reported she had all essential items for the infant including a bassinet and car seat.  CSW identifies no further need for intervention and no barriers to discharge at this time.  Emily Yu, LCSWA Clinical Social Worker [phone]

## 2024-03-06 ENCOUNTER — Other Ambulatory Visit (HOSPITAL_COMMUNITY): Payer: Self-pay

## 2024-03-06 ENCOUNTER — Encounter: Admitting: Obstetrics and Gynecology

## 2024-03-06 DIAGNOSIS — D62 Acute posthemorrhagic anemia: Secondary | ICD-10-CM | POA: Diagnosis not present

## 2024-03-06 DIAGNOSIS — Z1331 Encounter for screening for depression: Secondary | ICD-10-CM | POA: Diagnosis not present

## 2024-03-06 MED ORDER — NIFEDIPINE ER 30 MG PO TB24
30.0000 mg | ORAL_TABLET | Freq: Every day | ORAL | 1 refills | Status: AC
Start: 2024-03-06 — End: ?
  Filled 2024-03-06: qty 90, 90d supply, fill #0

## 2024-03-06 MED ORDER — IBUPROFEN 600 MG PO TABS
600.0000 mg | ORAL_TABLET | Freq: Four times a day (QID) | ORAL | 1 refills | Status: AC
  Filled 2024-03-06: qty 30, 8d supply, fill #0

## 2024-03-06 MED ORDER — POTASSIUM CHLORIDE CRYS ER 20 MEQ PO TBCR
20.0000 meq | EXTENDED_RELEASE_TABLET | Freq: Every day | ORAL | 0 refills | Status: AC
  Filled 2024-03-06: qty 4, 4d supply, fill #0

## 2024-03-06 MED ORDER — BENZOCAINE-MENTHOL 20-0.5 % EX AERO: 1.0000 | INHALATION_SPRAY | CUTANEOUS | Status: AC | PRN

## 2024-03-06 MED ORDER — ACETAMINOPHEN 325 MG PO TABS: 650.0000 mg | ORAL_TABLET | ORAL | Status: AC | PRN

## 2024-03-06 MED ORDER — SERTRALINE HCL 50 MG PO TABS
50.0000 mg | ORAL_TABLET | Freq: Every day | ORAL | 1 refills | Status: DC
  Filled 2024-03-06: qty 30, 30d supply, fill #0

## 2024-03-06 MED ORDER — FERROUS SULFATE 325 (65 FE) MG PO TABS
325.0000 mg | ORAL_TABLET | ORAL | 1 refills | Status: AC
  Filled 2024-03-06: qty 30, 60d supply, fill #0

## 2024-03-06 MED ORDER — FUROSEMIDE 20 MG PO TABS
20.0000 mg | ORAL_TABLET | Freq: Every day | ORAL | 0 refills | Status: AC
  Filled 2024-03-06: qty 4, 4d supply, fill #0

## 2024-03-06 NOTE — Discharge Instructions (Signed)
 SABRA

## 2024-03-06 NOTE — Lactation Note (Signed)
 This note was copied from a baby's chart. Lactation Consultation Note  Patient Name: Emily Yu Unijb'd Date: 03/06/2024 Age:28 hours, P1  Reason for consult: Follow-up assessment;Primapara;1st time breastfeeding;Early term 37-38.6wks;Infant < 6lbs;Infant weight loss (6 % weight loss) Per mom expressed excitement that her milk is coming in and the volume of milk has increased with pumping.  LC reminded mom that her baby did latch with 1st LC that saw her and probably would do it again.  LC recommended to try to latch 1st and if the baby latches let her feed for 15 -20 mins and then supplement 30 ml and after baby is settled post pump both breast 15 mins,safe the milk and next feeding switch to the other breast .  LC recommended if its a feeding where the baby won't latch feed her from the bottle and post pump. LC reviewed breast feeding D/C teaching and the [facility] resources.  Mom aware she has a LC O/P apt.on 9/15 3:30 pm   Maternal Data Has patient been taught Hand Expression?: Yes Does the patient have breastfeeding experience prior to this delivery?: No  Feeding Mother's Current Feeding Choice: Breast Milk and Formula Nipple Type: Extra Slow Flow  LATCH Score - baby recently was fed formula and is sound asleep.     Lactation Tools Discussed/Used Tools: Pump Breast pump type: Double-Electric Breast Pump Pump Education: Milk Storage;Setup, frequency, and cleaning Pumped volume: 30 mL (per mom)  Interventions  Reviewed breast feeding basics, storage of breast milk.  Mom aware of the Lactation resources.   Discharge Discharge Education: Engorgement and breast care;Warning signs for feeding baby;Outpatient recommendation;Other (comment) (LC O/P entered apt date and time yesterday and mom aware) Pump: DEBP;Personal  Consult Status Consult Status: Complete Date: 03/06/24    Rollene Jenkins Fiedler 03/06/2024, 9:42 AM

## 2024-03-08 ENCOUNTER — Telehealth (HOSPITAL_COMMUNITY): Payer: Self-pay

## 2024-03-08 DIAGNOSIS — Z1331 Encounter for screening for depression: Secondary | ICD-10-CM

## 2024-03-08 NOTE — Telephone Encounter (Signed)
 Hospital inpatient EPDS = 10. CSW consult completed during hospital stay. Ambulatory referral to Integrated Behavioral Health placed now. Notified Dr. Eveline via chart or IBH referral.   Rosaline Deretha PEAK 03/08/24,1312

## 2024-03-09 ENCOUNTER — Inpatient Hospital Stay (HOSPITAL_COMMUNITY)

## 2024-03-11 ENCOUNTER — Ambulatory Visit (INDEPENDENT_AMBULATORY_CARE_PROVIDER_SITE_OTHER)

## 2024-03-11 NOTE — Progress Notes (Signed)
 Subjective:  Emily Yu is a G2P1011 here for BP check.  She is 1 week postpartum following a normal spontaneous vaginal delivery. Pt reports not taking BP meds, or monitoring BP at home. Pt reports pain at IV site, warmth, swelling, and itching. She has been icing the area with no relief. States that she received iron  and had a reaction. She reports that the pain radiates to her elbow and down into her hand.    Hypertension ROS: Patient denies headache and visual changes   Objective:  LMP 06/10/2023 (Approximate)  131/85 (HR 84); 97.8 oral Appearance alert, well appearing, and in no distress. Swelling noted at IV site, no redness. Pt is afebrile.  Assessment:   Blood Pressure well controlled.   Plan:  Advised for patient to report to MAU to have IV site evaluated. Follow up in person for post-partum visit on 04/16/24.    Emily LITTIE Pizza, RN

## 2024-03-26 ENCOUNTER — Ambulatory Visit: Admitting: Licensed Clinical Social Worker

## 2024-03-26 DIAGNOSIS — R69 Illness, unspecified: Secondary | ICD-10-CM

## 2024-03-26 NOTE — BH Specialist Note (Signed)
 Integrated Behavioral Health via Telemedicine Visit  03/26/2024 Emily Yu [id number]   Referring Provider: Dr. Jhonny Patient/Family location: At home [facility] Provider location: Remote Office All persons participating in visit: Patient and [facility] Types of Service: Individual psychotherapy and Video visit  I connected with Emily Yu and/or Emily Yu patient via  Telephone or Video Enabled Telemedicine Application  (Video is Caregility application) and verified that I am speaking with the correct person using two identifiers. Discussed confidentiality: Yes   I discussed the limitations of telemedicine and the availability of in person appointments.  Discussed there is a possibility of technology failure and discussed alternative modes of communication if that failure occurs.  I discussed that engaging in this telemedicine visit, they consent to the provision of behavioral healthcare and the services will be billed under their insurance.  Patient and/or legal guardian expressed understanding and consented to Telemedicine visit: Yes     Patient and/or Family Response: Patient was present for virtual session and reports that today wasn't a good day for her. Patient requested to reschedule this appointment. Appointment scheduled for 03/27/2024.     I discussed the assessment and treatment plan with the patient and/or parent/guardian. They were provided an opportunity to ask questions and all were answered. They agreed with the plan and demonstrated an understanding of the instructions.   They were advised to call back or seek an in-person evaluation if the symptoms worsen or if the condition fails to improve as anticipated.  [name], LCSWA

## 2024-03-27 ENCOUNTER — Encounter: Admitting: Licensed Clinical Social Worker

## 2024-03-27 ENCOUNTER — Ambulatory Visit: Payer: Self-pay | Admitting: Licensed Clinical Social Worker

## 2024-04-16 ENCOUNTER — Ambulatory Visit: Admitting: Physician Assistant

## 2024-05-08 ENCOUNTER — Ambulatory Visit: Admitting: Obstetrics

## 2024-06-02 ENCOUNTER — Ambulatory Visit: Admitting: Obstetrics

## 2024-06-12 ENCOUNTER — Ambulatory Visit: Admitting: Obstetrics

## 2024-06-19 ENCOUNTER — Ambulatory Visit (INDEPENDENT_AMBULATORY_CARE_PROVIDER_SITE_OTHER): Admitting: Physician Assistant

## 2024-06-19 VITALS — BP 139/75 | HR 92 | Ht 64.0 in | Wt 196.2 lb

## 2024-06-19 DIAGNOSIS — F3289 Other specified depressive episodes: Secondary | ICD-10-CM | POA: Diagnosis not present

## 2024-06-19 NOTE — Progress Notes (Signed)
 Pt presents for mood check. Pt has being taking Zoloft  daily. She states she feels her mood worsened since delivery   Pt c/o numbness and tingling in the right arm after iron  infusion in hospital. This problem has be ongoing

## 2024-06-19 NOTE — Progress Notes (Signed)
 "   HPI: Emily Yu is a 28 y.o. G2P1011  single female is recently postpartum (03/04/24), sent to us  for management of depression. Patient start sertraline  50 mg 7 months ago, with improvement of symptoms. Saw [facility] a couple weeks after delivery. She reports today that since having her baby, she has felt significantly worse.   GYNECOLOGIC HISTORY: No LMP recorded. Contraception: none.   Menopausal hormone therapy: premenopausal Last mammogram:  never done due to age Last pap smear:  Diagnosis  Date Value Ref Range Status  08/28/2023   Final   - Negative for intraepithelial lesion or malignancy (NILM)           OB History     Gravida  2   Para  1   Term  1   Preterm  0   AB  1   Living  1      SAB  1   IAB  0   Ectopic  0   Multiple  0   Live Births  1              Patient Active Problem List   Diagnosis Date Noted   Acute blood loss anemia 03/06/2024   Positive depression screening 03/06/2024   Gestational hypertension 03/04/2024   Depression affecting pregnancy, antepartum 11/13/2023   Carrier of genetic disorder, SMA carrier 09/17/2023   Hypertension in pregnancy 08/28/2023   Obesity in pregnancy with antepartum complication 08/28/2023   Supervision of other normal pregnancy, antepartum 08/15/2023   History of hysteroscopy 11/07/2020    Past Medical History:  Diagnosis Date   Anemia    Concussion 12 yrs ago   no residual had temporary memory loss   COVID 07/26/2020   bulge on neck x 7 days all symptoms resolved   Endometrial mass     Past Surgical History:  Procedure Laterality Date   HYSTEROSCOPY WITH D & C N/A 09/08/2020   Procedure: DILATATION AND CURETTAGE /HYSTEROSCOPY AND RESECTION OF ENDOMETRIAL MASS;  Surgeon: Cleotilde Ronal GORMAN, MD;  Location: [facility] [place];  Service: Gynecology;  Laterality: N/A;   wisdome teeth exaction     WRIST SURGERY Left 12 yrs ago   had pins later removed    Current Outpatient Medications   Medication Sig Dispense Refill   sertraline  (ZOLOFT ) 50 MG tablet Take 1 tablet (50 mg total) by mouth daily. 30 tablet 1   acetaminophen  (TYLENOL ) 325 MG tablet Take 2 tablets (650 mg total) by mouth every 4 (four) hours as needed (for pain scale < 4). (Patient not taking: Reported on 06/19/2024)     albuterol  (VENTOLIN  HFA) 108 (90 Base) MCG/ACT inhaler Inhale 2 puffs into the lungs every 6 (six) hours as needed for shortness of breath (or cough). (Patient not taking: Reported on 06/19/2024) 18 g 0   benzocaine -Menthol  (DERMOPLAST) 20-0.5 % AERO Apply 1 Application topically as needed for irritation (perineal discomfort). (Patient not taking: Reported on 06/19/2024)     Blood Pressure Monitoring (BLOOD PRESSURE KIT) DEVI 1 Device by Does not apply route once a week. (Patient not taking: Reported on 06/19/2024) 1 each 0   ferrous sulfate  325 (65 FE) MG tablet Take 1 tablet (325 mg total) by mouth every other day. (Patient not taking: Reported on 06/19/2024) 30 tablet 1   furosemide  (LASIX ) 20 MG tablet Take 1 tablet (20 mg total) by mouth daily. (Patient not taking: Reported on 06/19/2024) 4 tablet 0   ibuprofen  (ADVIL ) 600 MG tablet  Take 1 tablet (600 mg total) by mouth every 6 (six) hours. (Patient not taking: Reported on 06/19/2024) 30 tablet 1   NIFEdipine  (ADALAT  CC) 30 MG 24 hr tablet Take 1 tablet (30 mg total) by mouth daily. (Patient not taking: Reported on 06/19/2024) 90 tablet 1   potassium chloride  SA (KLOR-CON  M) 20 MEQ tablet Take 1 tablet (20 mEq total) by mouth daily. (Patient not taking: Reported on 06/19/2024) 4 tablet 0   Prenatal Vit-Fe Fumarate-FA (PRENATAL VITAMINS PO) Take 1 tablet by mouth daily.     No current facility-administered medications for this visit.     ALLERGIES: Venofer  [iron  sucrose] and Lactose intolerance (gi)  Family History  Problem Relation Age of Onset   Hypertension Mother    Diabetes Other    Hypertension Other     Social History    Socioeconomic History   Marital status: Single    Spouse name: Not on file   Number of children: Not on file   Years of education: Not on file   Highest education level: Not on file  Occupational History   Not on file  Tobacco Use   Smoking status: Former    Types: E-cigarettes   Smokeless tobacco: Never  Vaping Use   Vaping status: Former   Substances: Nicotine, CBD  Substance and Sexual Activity   Alcohol use: Not Currently    Comment: occ   Drug use: Not Currently   Sexual activity: Not Currently    Birth control/protection: None  Other Topics Concern   Not on file  Social History Narrative   Not on file   Social Drivers of Health   Tobacco Use: Medium Risk (03/11/2024)   Patient History    Smoking Tobacco Use: Former    Smokeless Tobacco Use: Never    Passive Exposure: Not on Actuary Strain: Not on file  Food Insecurity: No Food Insecurity (03/04/2024)   Epic    Worried About Radiation Protection Practitioner of Food in the Last Year: Never true    Ran Out of Food in the Last Year: Never true  Transportation Needs: No Transportation Needs (03/04/2024)   Epic    Lack of Transportation (Medical): No    Lack of Transportation (Non-Medical): No  Physical Activity: Not on file  Stress: Not on file  Social Connections: Not on file  Intimate Partner Violence: Not At Risk (03/04/2024)   Epic    Fear of Current or Ex-Partner: No    Emotionally Abused: No    Physically Abused: No    Sexually Abused: No  Depression (PHQ2-9): High Risk (06/19/2024)   Depression (PHQ2-9)    PHQ-2 Score: 20  Alcohol Screen: Not on file  Housing: Low Risk (03/04/2024)   Epic    Unable to Pay for Housing in the Last Year: No    Number of Times Moved in the Last Year: 0    Homeless in the Last Year: No  Utilities: Not At Risk (03/04/2024)   Epic    Threatened with loss of utilities: No  Health Literacy: Not on file    Review of Systems  PHYSICAL EXAMINATION:    BP 139/75   Pulse 92   Ht  5' 4 (1.626 m)   Wt 196 lb 3.2 oz (89 kg)   BMI 33.68 kg/m     General appearance: +Tearfulness, alert, cooperative and appears stated age Head: Normocephalic, without obvious abnormality, atraumatic Lungs: normal respiratory effort Skin: Skin color, texture, turgor normal. No rashes  or lesions Neurologic: Grossly normal  ASSESSMENT & PLAN   Other depression Patient with depressive symptoms that began during pregnancy; she was started on sertraline  50 mg then. Her symptoms initially improved, but she is feeling worse in the postpartum period. I had her complete an [place] survey today, which indicates severe depression and is available in media tab. Patient denies SI, HI, or thoughts of hurting her baby. She did attend [facility] session session several months ago after delivery, but has not seen anyone for therapy since.  Given her score, will increase sertraline  dose to 100 mg with follow-up appointment in 1 week and get her back onto our schedule for [facility]. Placed referrals to psychiatry and primary care as well in the meantime. Discussed [facility] availability 24/7 without needing to make appointment, and precautions for seeking emergency care, to which patient stated understanding. Contact information included in AVS.   An After Visit Summary was printed and given to the patient.  Wanda Rideout E Nishi Neiswonger, PA-C 12/24/202510:12 AM "

## 2024-06-25 NOTE — Patient Instructions (Signed)
 [facility]  Phone [phone]  Address [address]. [place], [place] [address]  Hours Open 24/7. No appointment required.

## 2024-06-30 ENCOUNTER — Ambulatory Visit (INDEPENDENT_AMBULATORY_CARE_PROVIDER_SITE_OTHER): Payer: Self-pay | Admitting: Licensed Clinical Social Worker

## 2024-06-30 DIAGNOSIS — F53 Postpartum depression: Secondary | ICD-10-CM | POA: Diagnosis not present

## 2024-06-30 NOTE — BH Specialist Note (Signed)
 "   Integrated Behavioral Health via Telemedicine Visit  06/30/2024 NABIHA PLANCK [id number]  Number of Integrated Behavioral Health Clinician visits: 1- Initial Visit  Session Start time: 0812   Session End time: 0909  Total time in minutes: 57    Referring Provider: Dr. Jhonny Patient/Family location: At home [facility] Provider location: Remote Office All persons participating in visit: Patient and [facility]  Types of Service: Individual psychotherapy and Video visit  I connected with Marcelline GORMAN Molly and/or Marcelline GORMAN [place] patient via  Telephone or Video Enabled Telemedicine Application  (Video is Caregility application) and verified that I am speaking with the correct person using two identifiers. Discussed confidentiality: Yes   I discussed the limitations of telemedicine and the availability of in person appointments.  Discussed there is a possibility of technology failure and discussed alternative modes of communication if that failure occurs.  I discussed that engaging in this telemedicine visit, they consent to the provision of behavioral healthcare and the services will be billed under their insurance.  Patient and/or legal guardian expressed understanding and consented to Telemedicine visit: Yes   Presenting Concerns: Patient and/or family reports the following symptoms/concerns: increased depressive symptoms.  Duration of problem: Months; Severity of problem: moderate  Patient and/or Family's Strengths/Protective Factors: Social and Emotional competence, Concrete supports in place (healthy food, safe environments, etc.), Physical Health (exercise, healthy diet, medication compliance, etc.), and Caregiver has knowledge of parenting & child development  Goals Addressed: Patient will:  Reduce symptoms of: anxiety and depression   Increase knowledge and/or ability of: coping skills, healthy habits, and self-management skills   Demonstrate ability to: Increase healthy adjustment to  current life circumstances and Increase adequate support systems for patient/family  Progress towards Goals: Ongoing    Interventions: Interventions utilized:  Mindfulness or Management Consultant, Supportive Counseling, Psychoeducation and/or Health Education, Communication Skills, and Supportive Reflection Standardized Assessments completed: Not Needed    Patient and/or Family Response: The patient was present for todays virtual session and discussed ongoing mood concerns in the postpartum period, noting her daughter is almost four months old. She reports persistent sadness, frequent self-doubt regarding her abilities as a mother, and feeling as though she is masking her depressive symptoms to function day to day. The patient identified support from her mother, grandmother, and boyfriend, though her boyfriend does not reside locally and she primarily sees him on weekends. The behavioral health clinician reviewed the patients PHQ-9 and GAD-7 scores completed on 12/18, both of which were 20, indicating significant depressive and anxiety symptoms. The patient reported a history of Zoloft  use and shared that she was recently advised to increase her dose by doubling current medications, noting improvement in mood, thoughts, and emotional regulation after doing so; she requested a refill and dosage increase during this session. She also endorsed decreased motivation and loss of interest in previously enjoyable activities since being off medication. The patient identified a plan to resume medication as prescribed, increase reliance on her grandmother for support when needed, prioritize rest, and intentionally re-engage in drawing and listening to music as part of her coping routine.   Clinical Assessment/Diagnosis  Postpartum depression    Assessment: Patient currently experiencing significant depressive symptoms in the postpartum period, including persistent sadness, low self-worth related to  parenting, and decreased motivation. She also reports anxiety and emotional exhaustion despite having some social support..   Patient may benefit from continued support of integrated behavioral health services.  Plan: Follow up with behavioral health clinician on :  07/07/2024 Behavioral recommendations: patient to continue consistent use of prescribed medication and maintain close follow-up with her prescribing provider to monitor symptom improvement. Ongoing therapy is encouraged to address postpartum mood concerns, challenge negative self-beliefs related to parenting, and support behavioral activation through rest, creative activities, and use of family support. Referral(s): Integrated [employer] (In Clinic)  I discussed the assessment and treatment plan with the patient and/or parent/guardian. They were provided an opportunity to ask questions and all were answered. They agreed with the plan and demonstrated an understanding of the instructions.   They were advised to call back or seek an in-person evaluation if the symptoms worsen or if the condition fails to improve as anticipated.  Shelden Raborn LITTIE Seats, LCSWA "

## 2024-07-06 ENCOUNTER — Other Ambulatory Visit: Payer: Self-pay | Admitting: Physician Assistant

## 2024-07-06 DIAGNOSIS — F32A Depression, unspecified: Secondary | ICD-10-CM

## 2024-07-06 MED ORDER — SERTRALINE HCL 50 MG PO TABS: 100.0000 mg | ORAL_TABLET | Freq: Every day | ORAL | 1 refills | Status: DC

## 2024-07-07 ENCOUNTER — Encounter: Payer: Self-pay | Admitting: Licensed Clinical Social Worker

## 2024-07-09 ENCOUNTER — Telehealth: Payer: Self-pay | Admitting: Licensed Clinical Social Worker

## 2024-07-09 NOTE — Telephone Encounter (Signed)
 [facility] contacted patient on this date to inform her of her prescription. [facility] left a VM.

## 2024-07-11 ENCOUNTER — Other Ambulatory Visit (HOSPITAL_COMMUNITY): Payer: Self-pay

## 2024-07-11 ENCOUNTER — Telehealth: Payer: Self-pay | Admitting: Pharmacy Technician

## 2024-07-11 NOTE — Telephone Encounter (Signed)
 Pharmacy Patient Advocate Encounter   Received notification from [facility] KEY that prior authorization for Sertraline  HCl 50MG  tablets is required/requested.   Insurance verification completed.   The patient is insured through [facility] MEDICAID.   Per test claim: Refill too soon. PA is not needed at this time. Medication was filled 07/07/2024. Next eligible fill date is 08/01/2024.

## 2024-07-25 ENCOUNTER — Other Ambulatory Visit (HOSPITAL_COMMUNITY): Payer: Self-pay

## 2024-07-25 ENCOUNTER — Telehealth: Payer: Self-pay | Admitting: Pharmacy Technician

## 2024-07-25 NOTE — Telephone Encounter (Signed)
 Pharmacy Patient Advocate Encounter   Received notification from [facility] KEY that prior authorization for Sertraline  HCl 50MG  tablets is required/requested.   Insurance verification completed.   The patient is insured through [facility] MEDICAID.   Per test claim: Max daily dosage covered by plan is 1.5 tablets per day.  Can the order be resent as the 100mg  tablets take 1 tablet vs the 50mg  tablets take 2 tablets to allow for insurance coverage?  CMM Key# [id number]

## 2024-07-28 ENCOUNTER — Other Ambulatory Visit: Payer: Self-pay | Admitting: Physician Assistant

## 2024-07-28 DIAGNOSIS — F32A Depression, unspecified: Secondary | ICD-10-CM

## 2024-07-28 MED ORDER — SERTRALINE HCL 100 MG PO TABS: 100.0000 mg | ORAL_TABLET | Freq: Every day | ORAL | 1 refills | Status: AC

## 2024-07-30 NOTE — BH Specialist Note (Signed)
 A user error has taken place.
